# Patient Record
Sex: Female | Born: 1980 | Race: Black or African American | Hispanic: No | Marital: Single | Smoking: Never smoker
Health system: Southern US, Community
[De-identification: ages and names within clinical notes are randomized; demographics above are authoritative.]

## PROBLEM LIST (undated history)

## (undated) ENCOUNTER — Inpatient Hospital Stay (HOSPITAL_COMMUNITY): Payer: Self-pay

## (undated) DIAGNOSIS — D219 Benign neoplasm of connective and other soft tissue, unspecified: Secondary | ICD-10-CM

## (undated) DIAGNOSIS — R7303 Prediabetes: Secondary | ICD-10-CM

## (undated) DIAGNOSIS — O24419 Gestational diabetes mellitus in pregnancy, unspecified control: Secondary | ICD-10-CM

## (undated) DIAGNOSIS — E559 Vitamin D deficiency, unspecified: Secondary | ICD-10-CM

## (undated) HISTORY — DX: Vitamin D deficiency, unspecified: E55.9

## (undated) HISTORY — DX: Prediabetes: R73.03

---
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
## 2001-05-20 ENCOUNTER — Emergency Department (HOSPITAL_COMMUNITY): Admission: EM | Admit: 2001-05-20 | Discharge: 2001-05-20 | Payer: Self-pay | Admitting: Emergency Medicine

## 2001-07-19 ENCOUNTER — Inpatient Hospital Stay (HOSPITAL_COMMUNITY): Admission: AD | Admit: 2001-07-19 | Discharge: 2001-07-19 | Payer: Self-pay | Admitting: Obstetrics and Gynecology

## 2002-06-09 ENCOUNTER — Other Ambulatory Visit: Admission: RE | Admit: 2002-06-09 | Discharge: 2002-06-09 | Payer: Self-pay | Admitting: Gynecology

## 2002-12-01 ENCOUNTER — Emergency Department (HOSPITAL_COMMUNITY): Admission: AD | Admit: 2002-12-01 | Discharge: 2002-12-01 | Payer: Self-pay | Admitting: Family Medicine

## 2003-03-11 ENCOUNTER — Inpatient Hospital Stay (HOSPITAL_COMMUNITY): Admission: AD | Admit: 2003-03-11 | Discharge: 2003-03-12 | Payer: Self-pay | Admitting: Gynecology

## 2003-07-19 ENCOUNTER — Emergency Department (HOSPITAL_COMMUNITY): Admission: EM | Admit: 2003-07-19 | Discharge: 2003-07-19 | Payer: Self-pay | Admitting: Family Medicine

## 2003-08-10 ENCOUNTER — Inpatient Hospital Stay (HOSPITAL_COMMUNITY): Admission: AD | Admit: 2003-08-10 | Discharge: 2003-08-11 | Payer: Self-pay | Admitting: *Deleted

## 2003-08-30 ENCOUNTER — Emergency Department (HOSPITAL_COMMUNITY): Admission: EM | Admit: 2003-08-30 | Discharge: 2003-08-30 | Payer: Self-pay | Admitting: Family Medicine

## 2003-08-31 ENCOUNTER — Emergency Department (HOSPITAL_COMMUNITY): Admission: EM | Admit: 2003-08-31 | Discharge: 2003-08-31 | Payer: Self-pay | Admitting: Family Medicine

## 2003-09-03 ENCOUNTER — Emergency Department (HOSPITAL_COMMUNITY): Admission: EM | Admit: 2003-09-03 | Discharge: 2003-09-04 | Payer: Self-pay | Admitting: Emergency Medicine

## 2003-10-14 ENCOUNTER — Inpatient Hospital Stay (HOSPITAL_COMMUNITY): Admission: AD | Admit: 2003-10-14 | Discharge: 2003-10-15 | Payer: Self-pay | Admitting: Obstetrics & Gynecology

## 2003-10-17 ENCOUNTER — Inpatient Hospital Stay (HOSPITAL_COMMUNITY): Admission: AD | Admit: 2003-10-17 | Discharge: 2003-10-17 | Payer: Self-pay | Admitting: *Deleted

## 2003-11-23 ENCOUNTER — Other Ambulatory Visit: Admission: RE | Admit: 2003-11-23 | Discharge: 2003-11-23 | Payer: Self-pay | Admitting: Obstetrics and Gynecology

## 2004-01-17 ENCOUNTER — Inpatient Hospital Stay (HOSPITAL_COMMUNITY): Admission: AD | Admit: 2004-01-17 | Discharge: 2004-01-17 | Payer: Self-pay | Admitting: Obstetrics and Gynecology

## 2004-01-31 ENCOUNTER — Inpatient Hospital Stay (HOSPITAL_COMMUNITY): Admission: AD | Admit: 2004-01-31 | Discharge: 2004-01-31 | Payer: Self-pay | Admitting: Obstetrics and Gynecology

## 2004-02-01 ENCOUNTER — Inpatient Hospital Stay (HOSPITAL_COMMUNITY): Admission: AD | Admit: 2004-02-01 | Discharge: 2004-02-01 | Payer: Self-pay | Admitting: Obstetrics and Gynecology

## 2004-05-13 ENCOUNTER — Observation Stay (HOSPITAL_COMMUNITY): Admission: AD | Admit: 2004-05-13 | Discharge: 2004-05-14 | Payer: Self-pay | Admitting: Obstetrics and Gynecology

## 2004-06-01 ENCOUNTER — Inpatient Hospital Stay (HOSPITAL_COMMUNITY): Admission: AD | Admit: 2004-06-01 | Discharge: 2004-06-01 | Payer: Self-pay | Admitting: Obstetrics and Gynecology

## 2004-06-17 ENCOUNTER — Inpatient Hospital Stay (HOSPITAL_COMMUNITY): Admission: AD | Admit: 2004-06-17 | Discharge: 2004-06-20 | Payer: Self-pay | Admitting: Obstetrics and Gynecology

## 2004-06-17 ENCOUNTER — Inpatient Hospital Stay (HOSPITAL_COMMUNITY): Admission: AD | Admit: 2004-06-17 | Discharge: 2004-06-17 | Payer: Self-pay | Admitting: Obstetrics and Gynecology

## 2004-09-09 ENCOUNTER — Emergency Department (HOSPITAL_COMMUNITY): Admission: EM | Admit: 2004-09-09 | Discharge: 2004-09-09 | Payer: Self-pay | Admitting: Family Medicine

## 2005-05-11 ENCOUNTER — Emergency Department (HOSPITAL_COMMUNITY): Admission: EM | Admit: 2005-05-11 | Discharge: 2005-05-12 | Payer: Self-pay | Admitting: Emergency Medicine

## 2005-08-20 ENCOUNTER — Other Ambulatory Visit: Admission: RE | Admit: 2005-08-20 | Discharge: 2005-08-20 | Payer: Self-pay | Admitting: Obstetrics and Gynecology

## 2006-01-09 ENCOUNTER — Inpatient Hospital Stay (HOSPITAL_COMMUNITY): Admission: AD | Admit: 2006-01-09 | Discharge: 2006-01-09 | Payer: Self-pay | Admitting: Obstetrics and Gynecology

## 2006-11-12 ENCOUNTER — Emergency Department (HOSPITAL_COMMUNITY): Admission: EM | Admit: 2006-11-12 | Discharge: 2006-11-12 | Payer: Self-pay | Admitting: Emergency Medicine

## 2009-12-17 ENCOUNTER — Inpatient Hospital Stay (HOSPITAL_COMMUNITY)
Admission: AD | Admit: 2009-12-17 | Discharge: 2009-12-17 | Payer: Self-pay | Source: Home / Self Care | Admitting: Obstetrics & Gynecology

## 2010-03-27 LAB — GC/CHLAMYDIA PROBE AMP, GENITAL
Chlamydia, DNA Probe: NEGATIVE
GC Probe Amp, Genital: NEGATIVE

## 2010-03-27 LAB — WET PREP, GENITAL
Trich, Wet Prep: NONE SEEN
Yeast Wet Prep HPF POC: NONE SEEN

## 2010-03-27 LAB — POCT PREGNANCY, URINE: Preg Test, Ur: NEGATIVE

## 2010-06-02 NOTE — H&P (Signed)
[name], [name]             ACCOUNT NO.:  [id number]   MEDICAL RECORD NO.:  [id number]          PATIENT TYPE:  INP   LOCATION:  9168                          FACILITY:  [facility]   PHYSICIAN:  Jacqueline [name], M.D. DATE OF BIRTH:  16-Aug-1980   DATE OF ADMISSION:  06/17/2004  DATE OF DISCHARGE:                                HISTORY & PHYSICAL   This is a 30 year old, gravida 2, para 0-0-1-0, at 39-4/7th weeks who  presents unannounced with complaints of regular contractions increased in  frequency.  She was discharged home at 8:30 this morning with no cervical  change at 1-cm after a morphine sleeper.   This pregnancy has been followed by Dr. Stefano [name] and remarkable for:  1.  Anxiety.  2.  PENICILLIN allergy.  3.  History of gonorrhea.  4.  History of abnormal Pap.  5.  Group B strep negative.   ALLERGIES:  PENICILLIN causes a rash.   OBSTETRICAL HISTORY:  Elective abortion 2005 at 10 weeks' gestation.   MEDICAL HISTORY:  1.  Gonorrhea in 2005 which was treated.  2.  Childhood Varicella.  3.  History of abnormal Pap smear in 2003.  4.  Borderline anemia.  5.  History of anxiety for which she took Lexapro prior to pregnancy.   FAMILY HISTORY:  Remarkable for a strong family history of hypertension.  Brother with heart disease.  Brother with sickle cell trait.  Uncle with TB.  Grandmother and brother and uncle with diabetes.  Grandmother and brother  with stroke.   GENETIC HISTORY:  Remarkable for brother with sickle cell trait and  grandmother with twins.   SOCIAL HISTORY:  The patient is single.  Father of the baby is not involved.  Her room mate is with her as Psychologist, occupational.  She works as a Associate Professor.  She does  not report a religious affiliation.  She denies any alcohol, tobacco, or  drug use.   PRENATAL LABS:  Hemoglobin is 10.8, platelets 205.  Blood type A positive.  Antibody screen negative.  Sickle cell negative.  RPR nonreactive.  Hepatitis negative.  HIV  negative.  Pap test normal.  Gonorrhea negative.  Chlamydia negative.  Group B strep negative.   HISTORY OF CURRENT PREGNANCY:  The patient entered care at 10 weeks'  gestation.  Test to cure for gonorrhea treatment was done and was negative.  She had an ultrasound at 19 weeks which was normal.  She had an episode of  dizziness at 20 weeks.  She had another ultrasound at 22 weeks which was  normal.  Glucola at 30 weeks was normal.  Her Group B strep and cultures  were negative at term.   OBJECTIVE DATA:  VITAL SIGNS:  Stable, afebrile.  HEENT:  Within normal limits.  NECK:  Thyroid normal, not enlarged.  CHEST:  Clear to auscultation.  HEART:  Regular rate and rhythm.  ABDOMEN:  Gravid at 38-cm.  Vertex by Leopold's.  Fetal monitor shows a  reactive fetal heart rate tracing with uterine contractions every one and a  half to two minutes.  PELVIC:  Cervix  is 2- to -3-cm, 90% effaced, -1 station, with a vertex  presentation.  There is positive bloody show and intact membranes.  EXTREMITIES:  Within normal limits.   ASSESSMENT:  1.  Intrauterine pregnancy at 39-4/7th weeks.  2.  Latent phase labor.   PLAN:  1.  Per Dr. Estanislado Pandy, admit to birthing suite.  2.  Routine M.D. orders.  3.  Further to follow.      MLW/MEDQ  D:  06/17/2004  T:  06/17/2004  Job:  [id number]

## 2010-06-02 NOTE — Op Note (Signed)
[name], Jacqueline [name]             ACCOUNT NO.:  [id number]   MEDICAL RECORD NO.:  [id number]          PATIENT TYPE:  INP   LOCATION:  9120                          [facility]  [facility]   PHYSICIAN:  Crist Fat. Rivard, M.D. DATE OF BIRTH:  1980/06/05   DATE OF PROCEDURE:  06/17/2004  DATE OF DISCHARGE:                                 OPERATIVE REPORT   PREOPERATIVE DIAGNOSIS:  Intrauterine pregnancy at 39 weeks and 4 days with  failure to progress.   POSTOPERATIVE DIAGNOSIS:  Intrauterine pregnancy at 39 weeks and 4 days with  failure to progress.   PROCEDURE:  Primary low transverse cesarean section.   SURGEON:  Crist Fat. Rivard, M.D.   ASSISTANT:  Elby Showers. Williams, C.N.M.   ESTIMATED BLOOD LOSS:  800 mL.   PROCEDURE:  After being informed of the planned procedure with possible  complications including bleeding, infection, injury to bowel, bladder or  ureters, informed consent is obtained. The patient is taken to OR #4 and  preexisting epidural anesthesia was reinforced. The patient was placed in  the dorsal decubitus position, pelvis tilted to the left. She is prepped and  draped in a sterile fashion and a Foley catheter is already in her bladder.   After assessing adequate level of anesthesia, we infiltrate the suprapubic  area with 20 mL of Marcaine 0.25 and perform a Pfannenstiel incision which  was brought down to the fascia. Fascia is incised in a low transverse  fashion. Linea alba is dissected and peritoneum is entered in the midline  fashion. Visceral peritoneum was entered in a low transverse fashion  allowing [place] to safely retract bladder by developing a bladder flap.  Myometrium was then entered in a low transverse fashion first sharply and  extended bluntly. Amniotic fluid is clear. We assist the birth of a female  infant in LOA. Mouth and nose were suctioned with DeLee suction. Body was  delivered. Cord was clamped with two Kelly clamps and sectioned and the baby  was given to the pediatrician present in the room. The patient is given  clindamycin 900 milligrams IV and the placenta is allowed to deliver  spontaneously. It is complete, the cord has three vessels and uterine  revision is negative.   The uterus was then closed in two layers first with a running locked suture  of 0 Vicryl then with a Lembert suture of 0 Vicryl imbricating the first  layer. Hemostasis was completed midline with a figure-of-eight stitch of 0  Vicryl. Hemostasis was checked and adequate. Both paracolic gutters were  cleansed. Tubes and ovaries were assessed and normal. The pelvis was then  profusely irrigated with warm saline. Hemostasis was rechecked and adequate.   Under fascia hemostasis was completed with cautery and the fascia was closed  with two running suture of 0 Vicryl meeting midline. The wound was irrigated  with warm saline. Hemostasis was completed with cautery and the skin is  closed with subcuticular suture of 3-0 Monocryl and Steri-Strips.   Instrument and sponge count is complete x2. Estimated blood loss is 800 mL.  The procedure was well tolerated  by the patient who is taken to recovery  room in a well and stable condition. The little girl named [place] was born  at 11:15 p.m., received an Apgar of 9 at 1 minute, 9 at 5 minutes and weighs  7 pounds 6 ounces.       SAR/MEDQ  D:  06/17/2004  T:  06/18/2004  Job:  [id number]
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
## 2010-06-02 NOTE — Discharge Summary (Signed)
Jacqueline [name], Jacqueline [name]             ACCOUNT NO.:  [id number]   MEDICAL RECORD NO.:  [id number]          PATIENT TYPE:  INP   LOCATION:  9120                          [facility]   PHYSICIAN:  Crist Fat. Rivard, M.D. DATE OF BIRTH:  1980/09/25   DATE OF ADMISSION:  06/17/2004  DATE OF DISCHARGE:  06/20/2004                                 DISCHARGE SUMMARY   ADMISSION DIAGNOSES:  1.  Intrauterine pregnancy at 39-4/7 weeks.  2.  Latent phase labor.   DISCHARGE DIAGNOSES:  1.  Intrauterine pregnancy at 39-4/7 weeks.  2.  Failure to progress in labor.  3.  Status post primary low transverse Cesarean section of a female infant      named [place].  Apgar's 9 and 9, weighing 7 pounds 6 ounces.  4.  Postoperative anemia without hemodynamic compromise.   HOSPITAL PROCEDURES:  1.  Amniotomy.  2.  Electronic fetal monitoring.  3.  Internal fetal monitoring.  4.  Amnio-infusion.  5.  Primary low transverse Cesarean section.  6.  Epidural anesthesia.   HOSPITAL COURSE:  The patient was admitted in latent phase labor with cervix  dilated 2 to 3 cm and contractions every one-and-one-half to two minutes.  She received an epidural for pain and progressed to 4 cm later that  afternoon.  Amniotomy was performed to enhance labor and intrauterine  pressure catheter was inserted.  Amnio-infusion was started secondary to  repetitive variable decelerations and meconium-stained fluid.  Several hours  later cervical dilation was noted to have halted and decision was made at  that point to progress with primary low transverse Cesarean section for  failure to progress.  This was performed under epidural anesthesia by Dr.  Estanislado Pandy. EBL was 800 cc.  Female infant, [place], was born with Apgar's 9  and 9 and weight 7 pounds 6 ounces.  Patient and baby eventually were taken  to mother/baby unit where they did well.  Hemoglobin was 8.8 but patient  declined blood transfusion and was hemodynamically  stable.  Routine care was  given.  On postoperative day #2 she continued to improve, had some abdominal  distention secondary to gas.  On postoperative day #3 she was ready to go  home.  She received Dulcolax suppository for relief of gas.  She was  planning Micronor for contraception.  Vital signs were stable.  Heart  regular rate and rhythm.  Chest  was clear.  Abdomen was soft and  appropriately tender.  Incision was clean, dry and intact.  Lochia was  small.  Extremities within normal limits.  She is deemed to have received  the full benefit of her hospital stay and was discharged home.   DISCHARGE MEDICATIONS:  1.  Motrin 600 mg p.o. q.6h.  PRN.  2.  Tylox one to two q.4h. PRN.  3.  Micronor one p.o. daily.   DISCHARGE LABORATORY DATA:  White blood cell count 12.6, hemoglobin 8.8,  platelet count 128,000.  RPR nonreactive.   DISCHARGE INSTRUCTIONS:  Per [facility] OB handout.   FOLLOW UP:  Follow up in six weeks or PRN.  MLW/MEDQ  D:  06/20/2004  T:  06/20/2004  Job:  [id number]

## 2011-10-24 ENCOUNTER — Emergency Department (HOSPITAL_COMMUNITY)
Admission: EM | Admit: 2011-10-24 | Discharge: 2011-10-24 | Disposition: A | Discharge status: Home / Self Care | Payer: 59 | Source: Home / Self Care | Attending: Family Medicine | Admitting: Family Medicine

## 2011-10-24 ENCOUNTER — Encounter (HOSPITAL_COMMUNITY): Payer: Self-pay

## 2011-10-24 DIAGNOSIS — R197 Diarrhea, unspecified: Secondary | ICD-10-CM

## 2011-10-24 DIAGNOSIS — J069 Acute upper respiratory infection, unspecified: Secondary | ICD-10-CM

## 2011-10-24 MED ORDER — PHENYLEPH-PROMETHAZINE-COD 5-6.25-10 MG/5ML PO SYRP: ORAL_SOLUTION | ORAL | Status: DC

## 2011-10-24 MED ORDER — AZITHROMYCIN 250 MG PO TABS: 250.0000 mg | ORAL_TABLET | Freq: Every day | ORAL | Status: DC

## 2011-10-24 NOTE — ED Provider Notes (Signed)
History     CSN: [id number]  Arrival date & time 10/24/11  0901   First MD Initiated Contact with Patient 10/24/11 1024      Chief Complaint  Patient presents with  . Sore Throat    (Consider location/radiation/quality/duration/timing/severity/associated sxs/prior treatment) HPI Comments: The patient reports she started with diarrhea Sunday. Has continued until today. She has had a sore throat but has not measured a fever although she has had sweats and chills. She denies any vomiting. She admits to some cough. No runny nose or ear pain. Has taken Nyquil.   The history is provided by the patient.    History reviewed. No pertinent past medical history.  History reviewed. No pertinent past surgical history.  No family history on file.  History  Substance Use Topics  . Smoking status: Never Smoker   . Smokeless tobacco: Not on file  . Alcohol Use: No    OB History    Grav Para Term Preterm Abortions TAB SAB Ect Mult Living                  Review of Systems  Constitutional: Positive for fever, chills, appetite change and fatigue.  HENT: Positive for ear pain, congestion, sore throat, voice change and ear discharge. Negative for neck pain.   Respiratory: Positive for cough. Negative for chest tightness, shortness of breath and wheezing.   Cardiovascular: Negative.   Gastrointestinal: Positive for nausea and diarrhea. Negative for vomiting.  Genitourinary: Negative.   Musculoskeletal: Positive for arthralgias.  Skin: Negative.     Allergies  Penicillins  Home Medications   Current Outpatient Rx  Name Route Sig Dispense Refill  . AZITHROMYCIN 250 MG PO TABS Oral Take 1 tablet (250 mg total) by mouth daily. Take first 2 tablets together, then 1 every day until finished. 6 tablet 0  . PHENYLEPH-PROMETHAZINE-COD 5-6.25-10 MG/5ML PO SYRP  1-2 tsp po  q 6 hrs pr cough/nausea 120 mL 0    BP 150/93  Pulse 83  Temp 98.2 F (36.8 C) (Oral)  Resp 19  SpO2 99%  LMP  09/22/2011  Physical Exam  Nursing note and vitals reviewed. Constitutional: She appears well-developed and well-nourished. No distress.  HENT:  Head: Normocephalic and atraumatic.       Ears clear, nose clear, throat red. Mucus membranes moist and pink  Neck: Normal range of motion. Neck supple.  Cardiovascular: Normal rate, regular rhythm and normal heart sounds.   Pulmonary/Chest: Effort normal and breath sounds normal.  Abdominal: Soft. Bowel sounds are normal. She exhibits no mass. There is no tenderness. There is no rebound and no guarding.  Musculoskeletal: Normal range of motion.  Lymphadenopathy:    She has no cervical adenopathy.  Neurological: She is alert.    ED Course  Procedures (including critical care time)   Labs Reviewed  POCT RAPID STREP A ([facility]RG CARE ONLY)   No results found.   1. Diarrhea   2. URI (upper respiratory infection)       MDM          Randa Spike, MD 10/24/11 1042

## 2011-10-24 NOTE — ED Notes (Signed)
C/o sore throat and ear pain, body aches and some diarrhea sx started Sunday

## 2011-12-05 ENCOUNTER — Encounter (HOSPITAL_COMMUNITY): Payer: Self-pay | Admitting: *Deleted

## 2011-12-05 ENCOUNTER — Emergency Department (HOSPITAL_COMMUNITY)
Admission: EM | Admit: 2011-12-05 | Discharge: 2011-12-05 | Disposition: A | Discharge status: Home / Self Care | Payer: 59 | Source: Home / Self Care | Attending: Family Medicine | Admitting: Family Medicine

## 2011-12-05 DIAGNOSIS — J0300 Acute streptococcal tonsillitis, unspecified: Secondary | ICD-10-CM

## 2011-12-05 DIAGNOSIS — J020 Streptococcal pharyngitis: Secondary | ICD-10-CM

## 2011-12-05 LAB — POCT RAPID STREP A: Streptococcus, Group A Screen (Direct): POSITIVE — AB

## 2011-12-05 MED ORDER — HYDROCODONE-ACETAMINOPHEN 7.5-500 MG/15ML PO SOLN: 10.0000 mL | Freq: Three times a day (TID) | ORAL | Status: DC | PRN

## 2011-12-05 MED ORDER — IBUPROFEN 800 MG PO TABS
ORAL_TABLET | ORAL | Status: AC
  Filled 2011-12-05: qty 1

## 2011-12-05 MED ORDER — IBUPROFEN 800 MG PO TABS: 800.0000 mg | ORAL_TABLET | Freq: Three times a day (TID) | ORAL | Status: DC

## 2011-12-05 MED ORDER — CEPHALEXIN 500 MG PO CAPS: 500.0000 mg | ORAL_CAPSULE | Freq: Three times a day (TID) | ORAL | Status: DC

## 2011-12-05 MED ORDER — CEFTRIAXONE SODIUM 1 G IJ SOLR
INTRAMUSCULAR | Status: AC
  Filled 2011-12-05: qty 10

## 2011-12-05 MED ORDER — IBUPROFEN 800 MG PO TABS
800.0000 mg | ORAL_TABLET | Freq: Once | ORAL | Status: AC
  Administered 2011-12-05: 800 mg via ORAL

## 2011-12-05 MED ORDER — CEFTRIAXONE SODIUM 1 G IJ SOLR
1.0000 g | Freq: Once | INTRAMUSCULAR | Status: AC
  Administered 2011-12-05: 1 g via INTRAMUSCULAR

## 2011-12-05 NOTE — ED Notes (Signed)
Pt  Reports symptoms  Of  sorethroat            With  Pain  When   She  Swallows   Symptoms    X  2  Days         Pt  Also  Reports  Body  Aches  As  Well         Pt  Has  A  Hoarse  Voice    -  No  resp  Distress

## 2011-12-05 NOTE — ED Provider Notes (Signed)
History     CSN: [id number]  Arrival date & time 12/05/11  0802   First MD Initiated Contact with Patient 12/05/11 0805      Chief Complaint  Patient presents with  . Sore Throat    (Consider location/radiation/quality/duration/timing/severity/associated sxs/prior treatment) HPI Comments: 31 year old female with no significant past medical history. Nonsmoker. Here complaining of sore throat, headache and pain with swallowing since yesterday. Reports subjective fever last night. Taking over-the-counter Tylenol last time about 2 hours ago. Denies cough or nasal congestion. No chest pain or shortness of breath. Reports abdominal discomfort and nausea. No vomiting or diarrhea. No rash.   History reviewed. No pertinent past medical history.  History reviewed. No pertinent past surgical history.  No family history on file.  History  Substance Use Topics  . Smoking status: Never Smoker   . Smokeless tobacco: Not on file  . Alcohol Use: No    OB History    Grav Para Term Preterm Abortions TAB SAB Ect Mult Living                  Review of Systems  Constitutional: Positive for fever. Negative for diaphoresis, appetite change and fatigue.  HENT: Positive for sore throat and trouble swallowing. Negative for congestion, rhinorrhea and sinus pressure.   Respiratory: Negative for cough and shortness of breath.   Gastrointestinal: Negative for nausea, vomiting and abdominal pain.  Skin: Negative for rash.  Neurological: Positive for headaches.  All other systems reviewed and are negative.    Allergies  Penicillins  Home Medications   Current Outpatient Rx  Name  Route  Sig  Dispense  Refill  . AZITHROMYCIN 250 MG PO TABS   Oral   Take 1 tablet (250 mg total) by mouth daily. Take first 2 tablets together, then 1 every day until finished.   6 tablet   0   . CEPHALEXIN 500 MG PO CAPS   Oral   Take 1 capsule (500 mg total) by mouth 3 (three) times daily.   30 capsule  0   . HYDROCODONE-ACETAMINOPHEN 7.5-500 MG/15ML PO SOLN   Oral   Take 10 mLs by mouth every 8 (eight) hours as needed for pain.   120 mL   0   . IBUPROFEN 800 MG PO TABS   Oral   Take 1 tablet (800 mg total) by mouth 3 (three) times daily.   21 tablet   0   . PHENYLEPH-PROMETHAZINE-COD 5-6.25-10 MG/5ML PO SYRP      1-2 tsp po  q 6 hrs pr cough/nausea   120 mL   0     BP 121/81  Pulse 90  Temp 98.8 F (37.1 C) (Oral)  Resp 20  SpO2 100%  Physical Exam  Nursing note and vitals reviewed. Constitutional: She is oriented to person, place, and time. She appears well-developed and well-nourished. No distress.  HENT:  Head: Normocephalic and atraumatic.  Right Ear: External ear normal.  Left Ear: External ear normal.       Nose normal. Significant pharyngeal and tonsillar erythema with exudates. No uvula deviation. No trismus. TM's with increased vascular markings and some dullness bilaterally no swelling or bulging   Eyes: Conjunctivae normal are normal. No scleral icterus.  Neck: Neck supple. No thyromegaly present.  Cardiovascular: Normal heart sounds.   No murmur heard. Pulmonary/Chest: Breath sounds normal.  Abdominal: Soft. There is no tenderness.       No hepatosplenomegaly.  Lymphadenopathy:    She has cervical  adenopathy.  Neurological: She is alert and oriented to person, place, and time.  Skin: No rash noted. She is not diaphoretic.    ED Course  Procedures (including critical care time)  Labs Reviewed  POCT RAPID STREP A ([facility] ONLY) - Abnormal; Notable for the following:    Streptococcus, Group A Screen (Direct) POSITIVE (*)     All other components within normal limits   No results found.   1. Acute streptococcal tonsillitis       MDM  31 year old female here with bilateral exudative strep positive tonsillitis. History of penicillin allergies. Was challenged with a ceftriaxone IM dose here without side effects. Treated with Rocephin 1  g IM x1 ibuprofen 800 mg oral x1 here. Prescribed cephalexin 3 times a day for 10 days, hydrocodone/acetaminophen and ibuprofen prn. Supportive care and red flags that should prompt her return to medical attention discussed with patient and provided in writing.         Sharin Grave, MD 12/07/11 1443

## 2016-01-16 NOTE — L&D Delivery Note (Signed)
Delivery Note At  a viable female was delivered via  (Presentation vertex: ; OA ).  APGAR:7 ,9 ; weight  .   Placenta status:spont , shultz.  Cord:3vc  with the following complications: .  Cord pH: pending  Anesthesia:  epiduralEpisiotomy:   Lacerations:  1 degree vaginal Suture Repair: 2.0 vicryl Est. Blood Loss 100 (mL):    Mom to postpartum.  Baby to Couplet care / Skin to Skin.  Jacqueline [name] 11/18/2016, 11:48 AM

## 2016-02-12 ENCOUNTER — Encounter (HOSPITAL_COMMUNITY): Payer: Self-pay | Admitting: *Deleted

## 2016-02-12 ENCOUNTER — Inpatient Hospital Stay (HOSPITAL_COMMUNITY)
Admission: AD | Admit: 2016-02-12 | Discharge: 2016-02-12 | Disposition: A | Discharge status: Home / Self Care | Payer: Self-pay | Source: Ambulatory Visit | Attending: Obstetrics & Gynecology | Admitting: Obstetrics & Gynecology

## 2016-02-12 DIAGNOSIS — Z880 Allergy status to penicillin: Secondary | ICD-10-CM | POA: Insufficient documentation

## 2016-02-12 DIAGNOSIS — B9689 Other specified bacterial agents as the cause of diseases classified elsewhere: Secondary | ICD-10-CM

## 2016-02-12 DIAGNOSIS — N898 Other specified noninflammatory disorders of vagina: Secondary | ICD-10-CM | POA: Insufficient documentation

## 2016-02-12 DIAGNOSIS — Z79899 Other long term (current) drug therapy: Secondary | ICD-10-CM | POA: Insufficient documentation

## 2016-02-12 DIAGNOSIS — N760 Acute vaginitis: Secondary | ICD-10-CM

## 2016-02-12 DIAGNOSIS — Z9889 Other specified postprocedural states: Secondary | ICD-10-CM | POA: Insufficient documentation

## 2016-02-12 LAB — POCT PREGNANCY, URINE: Preg Test, Ur: NEGATIVE

## 2016-02-12 LAB — URINALYSIS, ROUTINE W REFLEX MICROSCOPIC
BILIRUBIN URINE: NEGATIVE
GLUCOSE, UA: NEGATIVE mg/dL
HGB URINE DIPSTICK: NEGATIVE
Ketones, ur: NEGATIVE mg/dL
Leukocytes, UA: NEGATIVE
Nitrite: NEGATIVE
PH: 5 (ref 5.0–8.0)
Protein, ur: NEGATIVE mg/dL
SPECIFIC GRAVITY, URINE: 1.021 (ref 1.005–1.030)

## 2016-02-12 LAB — WET PREP, GENITAL
SPERM: NONE SEEN
Trich, Wet Prep: NONE SEEN
Yeast Wet Prep HPF POC: NONE SEEN

## 2016-02-12 MED ORDER — METRONIDAZOLE 500 MG PO TABS: 500.0000 mg | ORAL_TABLET | Freq: Two times a day (BID) | ORAL | 0 refills | Status: DC

## 2016-02-12 NOTE — Discharge Instructions (Signed)
Get your medication and take all as directed. Establish with a doctor in [place] and follow up for continued care.

## 2016-02-12 NOTE — MAU Note (Signed)
Vaginal irritation, soreness and odor.  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

## 2016-02-12 NOTE — MAU Provider Note (Signed)
History     CSN: [id number]  Arrival date and time: 02/12/16 1013   First Provider Initiated Contact with Patient 02/12/16 1047      Chief Complaint  Patient presents with  . vaginal irritation   HPI Jacqueline [name] 36 y.o. comes in as she thinks she has BV again.  Was treated last in December by her doctor in [place], but is now living in [place].  Is transferring her insurance and has not established with an MD here yet.  Having vaginal discharge with odor.   OB History    Gravida Para Term Preterm AB Living   1 1 1     1    SAB TAB Ectopic Multiple Live Births           1      History reviewed. No pertinent past medical history.  Past Surgical History:  Procedure Laterality Date  . CESAREAN SECTION      History reviewed. No pertinent family history.  Social History  Substance Use Topics  . Smoking status: Never Smoker  . Smokeless tobacco: Never Used  . Alcohol use Yes     Comment: Occassional    Allergies:  Allergies  Allergen Reactions  . Penicillins Hives, Shortness Of Breath and Other (See Comments)    Has patient had a PCN reaction causing immediate rash, facial/tongue/throat swelling, SOB or lightheadedness with hypotension: Yes Has patient had a PCN reaction causing severe rash involving mucus membranes or skin necrosis: No Has patient had a PCN reaction that required hospitalization No Has patient had a PCN reaction occurring within the last 10 years: Yes If all of the above answers are "NO", then may proceed with Cephalosporin use.    Prescriptions Prior to Admission  Medication Sig Dispense Refill Last Dose  . mometasone (NASONEX) 50 MCG/ACT nasal spray Place 1 spray into the nose daily.   02/12/2016 at Unknown time    Review of Systems  Constitutional: Negative for fever.  Gastrointestinal: Negative for abdominal pain, nausea and vomiting.  Genitourinary: Positive for vaginal discharge. Negative for dysuria.       Odor with  vaginal discharge   Physical Exam   Blood pressure 129/77, pulse 75, temperature 98.3 F (36.8 C), temperature source Oral, resp. rate 16, last menstrual period 02/02/2016, SpO2 99 %.  Physical Exam  Nursing note and vitals reviewed. Constitutional: She is oriented to person, place, and time. She appears well-developed and well-nourished.  HENT:  Head: Normocephalic.  Eyes: EOM are normal.  Neck: Neck supple.  GI: Soft. There is no tenderness.  Genitourinary:  Genitourinary Comments: Speculum exam:vulva - vaginal discharge seen on labia minora prior to speculum being inserted Vagina - Small amount of liquid white discharge Cervix - No contact bleeding Bimanual exam: Cervix closed Uterus non tender, normal size Adnexa non tender, no masses bilaterally GC/Chlam, wet prep done Chaperone present for exam.   Musculoskeletal: Normal range of motion.  Neurological: She is alert and oriented to person, place, and time.  Skin: Skin is warm and dry.  Psychiatric: She has a normal mood and affect.    MAU Course  Procedures Results for orders placed or performed during the hospital encounter of 02/12/16 (from the past 24 hour(s))  Urinalysis, Routine w reflex microscopic     Status: Abnormal   Collection Time: 02/12/16 10:20 AM  Result Value Ref Range   Color, Urine YELLOW YELLOW   APPearance HAZY (A) CLEAR   Specific Gravity, Urine 1.021 1.005 - 1.030  pH 5.0 5.0 - 8.0   Glucose, UA NEGATIVE NEGATIVE mg/dL   Hgb urine dipstick NEGATIVE NEGATIVE   Bilirubin Urine NEGATIVE NEGATIVE   Ketones, ur NEGATIVE NEGATIVE mg/dL   Protein, ur NEGATIVE NEGATIVE mg/dL   Nitrite NEGATIVE NEGATIVE   Leukocytes, UA NEGATIVE NEGATIVE  Pregnancy, urine POC     Status: None   Collection Time: 02/12/16 10:36 AM  Result Value Ref Range   Preg Test, Ur NEGATIVE NEGATIVE  Wet prep, genital     Status: Abnormal   Collection Time: 02/12/16 10:48 AM  Result Value Ref Range   Yeast Wet Prep HPF POC  NONE SEEN NONE SEEN   Trich, Wet Prep NONE SEEN NONE SEEN   Clue Cells Wet Prep HPF POC PRESENT (A) NONE SEEN   WBC, Wet Prep HPF POC FEW (A) NONE SEEN   Sperm NONE SEEN     MDM Discussed current episode of BV and treatment  - will eprescribe metronidazole tablets to her pharmacy  Assessment and Plan  BV  Plan Get your medication and take all as directed. Establish with a doctor in [place] and follow up for continued care. Given info from [facility] on BV and alternate treatments when she has insurance.  Terri L Burleson 02/12/2016, 10:56 AM

## 2016-02-13 LAB — HIV ANTIBODY (ROUTINE TESTING W REFLEX): HIV Screen 4th Generation wRfx: NONREACTIVE

## 2016-02-13 LAB — RPR: RPR: NONREACTIVE

## 2016-02-14 LAB — GC/CHLAMYDIA PROBE AMP (~~LOC~~) NOT AT ARMC
Chlamydia: NEGATIVE
Neisseria Gonorrhea: NEGATIVE

## 2016-04-23 ENCOUNTER — Inpatient Hospital Stay (HOSPITAL_COMMUNITY)
Admission: AD | Admit: 2016-04-23 | Discharge: 2016-04-23 | Disposition: A | Discharge status: Home / Self Care | Payer: Medicaid Other | Source: Ambulatory Visit | Attending: Obstetrics & Gynecology | Admitting: Obstetrics & Gynecology

## 2016-04-23 ENCOUNTER — Encounter (HOSPITAL_COMMUNITY): Payer: Self-pay

## 2016-04-23 DIAGNOSIS — Z3689 Encounter for other specified antenatal screening: Secondary | ICD-10-CM

## 2016-04-23 DIAGNOSIS — Z3A01 Less than 8 weeks gestation of pregnancy: Secondary | ICD-10-CM | POA: Insufficient documentation

## 2016-04-23 DIAGNOSIS — B9689 Other specified bacterial agents as the cause of diseases classified elsewhere: Secondary | ICD-10-CM | POA: Diagnosis present

## 2016-04-23 DIAGNOSIS — N760 Acute vaginitis: Secondary | ICD-10-CM

## 2016-04-23 DIAGNOSIS — Z3490 Encounter for supervision of normal pregnancy, unspecified, unspecified trimester: Secondary | ICD-10-CM

## 2016-04-23 DIAGNOSIS — O23591 Infection of other part of genital tract in pregnancy, first trimester: Secondary | ICD-10-CM | POA: Insufficient documentation

## 2016-04-23 HISTORY — DX: Benign neoplasm of connective and other soft tissue, unspecified: D21.9

## 2016-04-23 LAB — URINALYSIS, ROUTINE W REFLEX MICROSCOPIC
Bilirubin Urine: NEGATIVE
Glucose, UA: NEGATIVE mg/dL
Hgb urine dipstick: NEGATIVE
Ketones, ur: NEGATIVE mg/dL
LEUKOCYTES UA: NEGATIVE
Nitrite: NEGATIVE
PH: 6 (ref 5.0–8.0)
Protein, ur: NEGATIVE mg/dL
SPECIFIC GRAVITY, URINE: 1.006 (ref 1.005–1.030)

## 2016-04-23 LAB — WET PREP, GENITAL
Sperm: NONE SEEN
TRICH WET PREP: NONE SEEN
Yeast Wet Prep HPF POC: NONE SEEN

## 2016-04-23 LAB — POCT PREGNANCY, URINE: Preg Test, Ur: POSITIVE — AB

## 2016-04-23 MED ORDER — METRONIDAZOLE 500 MG PO TABS: 500.0000 mg | ORAL_TABLET | Freq: Two times a day (BID) | ORAL | 0 refills | Status: AC

## 2016-04-23 NOTE — Discharge Instructions (Signed)
Bacterial Vaginosis °Bacterial vaginosis is a vaginal infection that occurs when the normal balance of bacteria in the vagina is disrupted. It results from an overgrowth of certain bacteria. This is the most common vaginal infection among women ages 15-44. °Because bacterial vaginosis increases your risk for STIs (sexually transmitted infections), getting treated can help reduce your risk for chlamydia, gonorrhea, herpes, and HIV (human immunodeficiency virus). Treatment is also important for preventing complications in pregnant women, because this condition can cause an early (premature) delivery. °What are the causes? °This condition is caused by an increase in harmful bacteria that are normally present in small amounts in the vagina. However, the reason that the condition develops is not fully understood. °What increases the risk? °The following factors may make you more likely to develop this condition: °· Having a new sexual partner or multiple sexual partners. °· Having unprotected sex. °· Douching. °· Having an intrauterine device (IUD). °· Smoking. °· Drug and alcohol abuse. °· Taking certain antibiotic medicines. °· Being pregnant. °You cannot get bacterial vaginosis from toilet seats, bedding, swimming pools, or contact with objects around you. °What are the signs or symptoms? °Symptoms of this condition include: °· Grey or white vaginal discharge. The discharge can also be watery or foamy. °· A fish-like odor with discharge, especially after sexual intercourse or during menstruation. °· Itching in and around the vagina. °· Burning or pain with urination. °Some women with bacterial vaginosis have no signs or symptoms. °How is this diagnosed? °This condition is diagnosed based on: °· Your medical history. °· A physical exam of the vagina. °· Testing a sample of vaginal fluid under a microscope to look for a large amount of bad bacteria or abnormal cells. Your health care provider may use a cotton swab or a  small wooden spatula to collect the sample. °How is this treated? °This condition is treated with antibiotics. These may be given as a pill, a vaginal cream, or a medicine that is put into the vagina (suppository). If the condition comes back after treatment, a second round of antibiotics may be needed. °Follow these instructions at home: °Medicines  °· Take over-the-counter and prescription medicines only as told by your health care provider. °· Take or use your antibiotic as told by your health care provider. Do not stop taking or using the antibiotic even if you start to feel better. °General instructions  °· If you have a female sexual partner, tell her that you have a vaginal infection. She should see her health care provider and be treated if she has symptoms. If you have a female sexual partner, he does not need treatment. °· During treatment: °¨ Avoid sexual activity until you finish treatment. °¨ Do not douche. °¨ Avoid alcohol as directed by your health care provider. °¨ Avoid breastfeeding as directed by your health care provider. °· Drink enough water and fluids to keep your urine clear or pale yellow. °· Keep the area around your vagina and rectum clean. °¨ Wash the area daily with warm water. °¨ Wipe yourself from front to back after using the toilet. °· Keep all follow-up visits as told by your health care provider. This is important. °How is this prevented? °· Do not douche. °· Wash the outside of your vagina with warm water only. °· Use protection when having sex. This includes latex condoms and dental dams. °· Limit how many sexual partners you have. To help prevent bacterial vaginosis, it is best to have sex with just one   partner (monogamous).  Make sure you and your sexual partner are tested for STIs.  Wear cotton or cotton-lined underwear.  Avoid wearing tight pants and pantyhose, especially during summer.  Limit the amount of alcohol that you drink.  Do not use any products that contain  nicotine or tobacco, such as cigarettes and e-cigarettes. If you need help quitting, ask your health care provider.  Do not use illegal drugs. Where to find more information:  Centers for Disease Control and Prevention: AppraiserFraud.fi  American Sexual Health Association (ASHA): www.ashastd.org  [place].S. Department of Health and Financial controller, Office on Women's Health: DustingSprays.pl or SecuritiesCard.it Contact a health care provider if:  Your symptoms do not improve, even after treatment.  You have more discharge or pain when urinating.  You have a fever.  You have pain in your abdomen.  You have pain during sex.  You have vaginal bleeding between periods. Summary  Bacterial vaginosis is a vaginal infection that occurs when the normal balance of bacteria in the vagina is disrupted.  Because bacterial vaginosis increases your risk for STIs (sexually transmitted infections), getting treated can help reduce your risk for chlamydia, gonorrhea, herpes, and HIV (human immunodeficiency virus). Treatment is also important for preventing complications in pregnant women, because the condition can cause an early (premature) delivery.  This condition is treated with antibiotic medicines. These may be given as a pill, a vaginal cream, or a medicine that is put into the vagina (suppository). This information is not intended to replace advice given to you by your health care provider. Make sure you discuss any questions you have with your health care provider. Document Released: 01/01/2005 Document Revised: 09/17/2015 Document Reviewed: 09/17/2015 Elsevier Interactive Patient Education  2017 [place] of Pregnancy The first trimester of pregnancy is from week 1 until the end of week 13 (months 1 through 3). A week after a sperm fertilizes an egg, the egg will implant on the wall of the uterus. This embryo will begin to develop  into a baby. Genes from you and your partner will form the baby. The female genes will determine whether the baby will be a boy or a girl. At 6-8 weeks, the eyes and face will be formed, and the heartbeat can be seen on ultrasound. At the end of 12 weeks, all the baby's organs will be formed. Now that you are pregnant, you will want to do everything you can to have a healthy baby. Two of the most important things are to get good prenatal care and to follow your health care provider's instructions. Prenatal care is all the medical care you receive before the baby's birth. This care will help prevent, find, and treat any problems during the pregnancy and childbirth. Body changes during your first trimester Your body goes through many changes during pregnancy. The changes vary from woman to woman.  You may gain or lose a couple of pounds at first.  You may feel sick to your stomach (nauseous) and you may throw up (vomit). If the vomiting is uncontrollable, call your health care provider.  You may tire easily.  You may develop headaches that can be relieved by medicines. All medicines should be approved by your health care provider.  You may urinate more often. Painful urination may mean you have a bladder infection.  You may develop heartburn as a result of your pregnancy.  You may develop constipation because certain hormones are causing the muscles that push stool through your intestines  to slow down.  You may develop hemorrhoids or swollen veins (varicose veins).  Your breasts may begin to grow larger and become tender. Your nipples may stick out more, and the tissue that surrounds them (areola) may become darker.  Your gums may bleed and may be sensitive to brushing and flossing.  Dark spots or blotches (chloasma, mask of pregnancy) may develop on your face. This will likely fade after the baby is born.  Your menstrual periods will stop.  You may have a loss of appetite.  You may  develop cravings for certain kinds of food.  You may have changes in your emotions from day to day, such as being excited to be pregnant or being concerned that something may go wrong with the pregnancy and baby.  You may have more vivid and strange dreams.  You may have changes in your hair. These can include thickening of your hair, rapid growth, and changes in texture. Some women also have hair loss during or after pregnancy, or hair that feels dry or thin. Your hair will most likely return to normal after your baby is born. What to expect at prenatal visits During a routine prenatal visit:  You will be weighed to make sure you and the baby are growing normally.  Your blood pressure will be taken.  Your abdomen will be measured to track your baby's growth.  The fetal heartbeat will be listened to between weeks 10 and 14 of your pregnancy.  Test results from any previous visits will be discussed. Your health care provider may ask you:  How you are feeling.  If you are feeling the baby move.  If you have had any abnormal symptoms, such as leaking fluid, bleeding, severe headaches, or abdominal cramping.  If you are using any tobacco products, including cigarettes, chewing tobacco, and electronic cigarettes.  If you have any questions. Other tests that may be performed during your first trimester include:  Blood tests to find your blood type and to check for the presence of any previous infections. The tests will also be used to check for low iron levels (anemia) and protein on red blood cells (Rh antibodies). Depending on your risk factors, or if you previously had diabetes during pregnancy, you may have tests to check for high blood sugar that affects pregnant women (gestational diabetes).  Urine tests to check for infections, diabetes, or protein in the urine.  An ultrasound to confirm the proper growth and development of the baby.  Fetal screens for spinal cord problems  (spina bifida) and Down syndrome.  HIV (human immunodeficiency virus) testing. Routine prenatal testing includes screening for HIV, unless you choose not to have this test.  You may need other tests to make sure you and the baby are doing well. Follow these instructions at home: Medicines   Follow your health care provider's instructions regarding medicine use. Specific medicines may be either safe or unsafe to take during pregnancy.  Take a prenatal vitamin that contains at least 600 micrograms (mcg) of folic acid.  If you develop constipation, try taking a stool softener if your health care provider approves. Eating and drinking   Eat a balanced diet that includes fresh fruits and vegetables, whole grains, good sources of protein such as meat, eggs, or tofu, and low-fat dairy. Your health care provider will help you determine the amount of weight gain that is right for you.  Avoid raw meat and uncooked cheese. These carry germs that can cause birth defects  in the baby.  Eating four or five small meals rather than three large meals a day may help relieve nausea and vomiting. If you start to feel nauseous, eating a few soda crackers can be helpful. Drinking liquids between meals, instead of during meals, also seems to help ease nausea and vomiting.  Limit foods that are high in fat and processed sugars, such as fried and sweet foods.  To prevent constipation:  Eat foods that are high in fiber, such as fresh fruits and vegetables, whole grains, and beans.  Drink enough fluid to keep your urine clear or pale yellow. Activity   Exercise only as directed by your health care provider. Most women can continue their usual exercise routine during pregnancy. Try to exercise for 30 minutes at least 5 days a week. Exercising will help you:  Control your weight.  Stay in shape.  Be prepared for labor and delivery.  Experiencing pain or cramping in the lower abdomen or lower back is a good  sign that you should stop exercising. Check with your health care provider before continuing with normal exercises.  Try to avoid standing for long periods of time. Move your legs often if you must stand in one place for a long time.  Avoid heavy lifting.  Wear low-heeled shoes and practice good posture.  You may continue to have sex unless your health care provider tells you not to. Relieving pain and discomfort   Wear a good support bra to relieve breast tenderness.  Take warm sitz baths to soothe any pain or discomfort caused by hemorrhoids. Use hemorrhoid cream if your health care provider approves.  Rest with your legs elevated if you have leg cramps or low back pain.  If you develop varicose veins in your legs, wear support hose. Elevate your feet for 15 minutes, 3-4 times a day. Limit salt in your diet. Prenatal care   Schedule your prenatal visits by the twelfth week of pregnancy. They are usually scheduled monthly at first, then more often in the last 2 months before delivery.  Write down your questions. Take them to your prenatal visits.  Keep all your prenatal visits as told by your health care provider. This is important. Safety   Wear your seat belt at all times when driving.  Make a list of emergency phone numbers, including numbers for family, friends, the hospital, and police and fire departments. General instructions   Ask your health care provider for a referral to a local prenatal education class. Begin classes no later than the beginning of month 6 of your pregnancy.  Ask for help if you have counseling or nutritional needs during pregnancy. Your health care provider can offer advice or refer you to specialists for help with various needs.  Do not use hot tubs, steam rooms, or saunas.  Do not douche or use tampons or scented sanitary pads.  Do not cross your legs for long periods of time.  Avoid cat litter boxes and soil used by cats. These carry germs  that can cause birth defects in the baby and possibly loss of the fetus by miscarriage or stillbirth.  Avoid all smoking, herbs, alcohol, and medicines not prescribed by your health care provider. Chemicals in these products affect the formation and growth of the baby.  Do not use any products that contain nicotine or tobacco, such as cigarettes and e-cigarettes. If you need help quitting, ask your health care provider. You may receive counseling support and other resources to  help you quit.  Schedule a dentist appointment. At home, brush your teeth with a soft toothbrush and be gentle when you floss. Contact a health care provider if:  You have dizziness.  You have mild pelvic cramps, pelvic pressure, or nagging pain in the abdominal area.  You have persistent nausea, vomiting, or diarrhea.  You have a bad smelling vaginal discharge.  You have pain when you urinate.  You notice increased swelling in your face, hands, legs, or ankles.  You are exposed to fifth disease or chickenpox.  You are exposed to [place] measles (rubella) and have never had it. Get help right away if:  You have a fever.  You are leaking fluid from your vagina.  You have spotting or bleeding from your vagina.  You have severe abdominal cramping or pain.  You have rapid weight gain or loss.  You vomit blood or material that looks like coffee grounds.  You develop a severe headache.  You have shortness of breath.  You have any kind of trauma, such as from a fall or a car accident. Summary  The first trimester of pregnancy is from week 1 until the end of week 13 (months 1 through 3).  Your body goes through many changes during pregnancy. The changes vary from woman to woman.  You will have routine prenatal visits. During those visits, your health care provider will examine you, discuss any test results you may have, and talk with you about how you are feeling. This information is not intended to  replace advice given to you by your health care provider. Make sure you discuss any questions you have with your health care provider. Document Released: 12/26/2000 Document Revised: 12/14/2015 Document Reviewed: 12/14/2015 Elsevier Interactive Patient Education  2017 [employer].

## 2016-04-23 NOTE — MAU Note (Signed)
Pt states last week she went to another hospital with vaginal bleeding and had a positive pregnancy test. Pt states the bleeding has gone away. Pt states she started noticing about a week ago some vaginal discharge that smells bad.

## 2016-04-23 NOTE — MAU Provider Note (Signed)
Chief Complaint: Vaginal Discharge   First Provider Initiated Contact with Patient 04/23/16 2135        SUBJECTIVE HPI: [name] is a 36 y.o. G2P1001 at 103w1d by LMP who presents to maternity admissions reporting vaginal discharge with odor, similar to [place] she has had before.. She denies vaginal bleeding, vaginal itching/burning, urinary symptoms, h/a, dizziness, n/v, or fever/chills.   Went to [name] last week and had an [place] to rule out ectopic pregnancy. They saw a single viable fetus. Did not do swabs.  Lives in [name] and works in [name].  Started [employer] and wants to come here for [facility]  Vaginal Discharge  The patient's primary symptoms include a genital odor and vaginal discharge. The patient's pertinent negatives include no genital itching, genital lesions, genital rash, pelvic pain or vaginal bleeding. This is a new problem. The current episode started in the past 7 days. The problem occurs constantly. The problem has been unchanged. The patient is experiencing no pain. The problem affects both sides. She is pregnant. Pertinent negatives include no abdominal pain, back pain, chills, constipation, diarrhea, dysuria, fever, nausea or vomiting. The vaginal discharge was milky and malodorous. There has been no bleeding. She has not been passing clots. She has not been passing tissue. Nothing aggravates the symptoms. She has tried nothing for the symptoms. It is unknown whether or not her partner has an STD.   RN Note: Pt states last week she went to another hospital with vaginal bleeding and had a positive pregnancy test. Pt states the bleeding has gone away. Pt states she started noticing about a week ago some vaginal discharge that smells bad.   No past medical history on file. Past Surgical History:  Procedure Laterality Date  . CESAREAN SECTION     Social History   Social History  . Marital status: Single    Spouse name: N/A  . Number of children: N/A  . Years of  education: N/A   Occupational History  . Not on file.   Social History Main Topics  . Smoking status: Never Smoker  . Smokeless tobacco: Never Used  . Alcohol use Yes     Comment: Occassional  . Drug use: No  . Sexual activity: Not on file   Other Topics Concern  . Not on file   Social History Narrative  . No narrative on file   No current facility-administered medications on file prior to encounter.    Current Outpatient Prescriptions on File Prior to Encounter  Medication Sig Dispense Refill  . metroNIDAZOLE (FLAGYL) 500 MG tablet Take 1 tablet (500 mg total) by mouth 2 (two) times daily. No alcohol while taking this medication 14 tablet 0  . mometasone (NASONEX) 50 MCG/ACT nasal spray Place 1 spray into the nose daily.     Allergies  Allergen Reactions  . Penicillins Hives, Shortness Of Breath and Other (See Comments)    Has patient had a PCN reaction causing immediate rash, facial/tongue/throat swelling, SOB or lightheadedness with hypotension: Yes Has patient had a PCN reaction causing severe rash involving mucus membranes or skin necrosis: No Has patient had a PCN reaction that required hospitalization No Has patient had a PCN reaction occurring within the last 10 years: Yes If all of the above answers are "NO", then may proceed with Cephalosporin use.    I have reviewed patient's Past Medical Hx, Surgical Hx, Family Hx, Social Hx, medications and allergies.   ROS:  Review of Systems  Constitutional: Negative  for chills and fever.  Gastrointestinal: Negative for abdominal pain, constipation, diarrhea, nausea and vomiting.  Genitourinary: Positive for vaginal discharge. Negative for dysuria and pelvic pain.  Musculoskeletal: Negative for back pain.   Review of Systems  Other systems negative   Physical Exam  Physical Exam Patient Vitals for the past 24 hrs:  BP Temp Temp src Pulse Resp SpO2 Height Weight  04/23/16 1849 124/84 98 F (36.7 C) Oral 88 18 100 %  5\' 4"  (1.626 m) 171 lb (77.6 kg)   Constitutional: Well-developed, well-nourished female in no acute distress.  Cardiovascular: normal rate Respiratory: normal effort GI: Abd soft, non-tender. Pos BS x 4 MS: Extremities nontender, no edema, normal ROM Neurologic: Alert and oriented x 4.  GU: Neg CVAT.  Bedside US showed an active 7 wk fetal pole with FHR 160s.  PELVIC EXAM: Cervix pink, visually closed, without lesion, scant white creamy discharge, vaginal walls and external genitalia normal  LAB RESULTS Results for orders placed or performed during the hospital encounter of 04/23/16 (from the past 24 hour(s))  Urinalysis, Routine w reflex microscopic     Status: Abnormal   Collection Time: 04/23/16  7:24 PM  Result Value Ref Range   Color, Urine STRAW (A) YELLOW   APPearance CLEAR CLEAR   Specific Gravity, Urine 1.006 1.005 - 1.030   pH 6.0 5.0 - 8.0   Glucose, UA NEGATIVE NEGATIVE mg/dL   Hgb urine dipstick NEGATIVE NEGATIVE   Bilirubin Urine NEGATIVE NEGATIVE   Ketones, ur NEGATIVE NEGATIVE mg/dL   Protein, ur NEGATIVE NEGATIVE mg/dL   Nitrite NEGATIVE NEGATIVE   Leukocytes, UA NEGATIVE NEGATIVE  Pregnancy, urine POC     Status: Abnormal   Collection Time: 04/23/16  7:41 PM  Result Value Ref Range   Preg Test, Ur POSITIVE (A) NEGATIVE       IMAGING No results found.  MAU Management/MDM: Discussed bacterial vaginosis Will Rx Flagyl Message sent to [facility] for prenatal appt   ASSESSMENT Single IUP at [redacted]w[redacted]d Bacterial vaginosis  PLAN Discharge home Rx Flagyl x 7 days for BV Encouraged to seek prenatal care  Pt stable at time of discharge. Encouraged to return here or to other Urgent Care/ED if she develops worsening of symptoms, increase in pain, fever, or other concerning symptoms.    Hansel Feinstein CNM, MSN Certified Nurse-Midwife 04/23/2016  9:36 PM

## 2016-04-24 LAB — GC/CHLAMYDIA PROBE AMP (~~LOC~~) NOT AT ARMC
CHLAMYDIA, DNA PROBE: NEGATIVE
Neisseria Gonorrhea: NEGATIVE

## 2016-05-30 ENCOUNTER — Encounter: Payer: Self-pay | Admitting: Obstetrics & Gynecology

## 2016-05-30 ENCOUNTER — Ambulatory Visit (INDEPENDENT_AMBULATORY_CARE_PROVIDER_SITE_OTHER): Payer: Medicaid Other | Admitting: Obstetrics & Gynecology

## 2016-05-30 ENCOUNTER — Other Ambulatory Visit (HOSPITAL_COMMUNITY)
Admission: RE | Admit: 2016-05-30 | Discharge: 2016-05-30 | Disposition: A | Discharge status: Home / Self Care | Payer: Medicaid Other | Source: Ambulatory Visit | Attending: Obstetrics & Gynecology | Admitting: Obstetrics & Gynecology

## 2016-05-30 VITALS — BP 129/79 | HR 95 | Wt 174.0 lb

## 2016-05-30 DIAGNOSIS — O209 Hemorrhage in early pregnancy, unspecified: Secondary | ICD-10-CM | POA: Diagnosis not present

## 2016-05-30 DIAGNOSIS — O09529 Supervision of elderly multigravida, unspecified trimester: Secondary | ICD-10-CM

## 2016-05-30 DIAGNOSIS — O09521 Supervision of elderly multigravida, first trimester: Secondary | ICD-10-CM

## 2016-05-30 DIAGNOSIS — J302 Other seasonal allergic rhinitis: Secondary | ICD-10-CM | POA: Insufficient documentation

## 2016-05-30 DIAGNOSIS — R87810 Cervical high risk human papillomavirus (HPV) DNA test positive: Secondary | ICD-10-CM | POA: Insufficient documentation

## 2016-05-30 DIAGNOSIS — O34219 Maternal care for unspecified type scar from previous cesarean delivery: Secondary | ICD-10-CM

## 2016-05-30 DIAGNOSIS — Z3491 Encounter for supervision of normal pregnancy, unspecified, first trimester: Secondary | ICD-10-CM | POA: Insufficient documentation

## 2016-05-30 DIAGNOSIS — J3089 Other allergic rhinitis: Secondary | ICD-10-CM | POA: Diagnosis not present

## 2016-05-30 DIAGNOSIS — Z3480 Encounter for supervision of other normal pregnancy, unspecified trimester: Secondary | ICD-10-CM | POA: Insufficient documentation

## 2016-05-30 NOTE — Progress Notes (Signed)
  Subjective:    Jacqueline [name] is being seen today for her first obstetrical visit. [id number] s/p EAB x2. This is not a planned pregnancy. She is at [redacted]w[redacted]d gestation. Her obstetrical history is significant for advanced maternal age. Relationship with FOB: significant other, not living together. Patient does intend to breast feed. Pregnancy history fully reviewed.  Patient reports no complaints.  Review of Systems:   Review of Systems [place]  Objective:     BP 129/79   Pulse 95   Wt 174 lb (78.9 kg)   LMP 03/04/2016 (Exact Date)   BMI 29.87 kg/m  Physical Exam  Exam  General Appearance:    Alert, cooperative, no distress, appears stated age  Head:    Normocephalic, without obvious abnormality, atraumatic  Eyes:    conjunctiva/corneas clear, EOM's intact, both eyes  Ears:    Normal external ear canals, both ears  Nose:   Nares normal, septum midline, mucosa normal, no drainage    or sinus tenderness  Throat:   Lips, mucosa, and tongue normal; teeth and gums normal  Neck:   Supple, symmetrical, trachea midline, no adenopathy;    thyroid:  no enlargement/tenderness/nodules  Back:     Symmetric, no curvature, ROM normal, no CVA tenderness  Lungs:     Clear to auscultation bilaterally, respirations unlabored  Chest Wall:    No tenderness or deformity   Heart:    Regular rate and rhythm, S1 and S2 normal, no murmur, rub   or gallop  Breast Exam:    No tenderness, masses, or nipple abnormality  Abdomen:     Soft, non-tender, bowel sounds active all four quadrants,    no masses, no organomegaly  Genitalia:    Normal female without lesion, discharge or tenderness     Extremities:   Extremities normal, atraumatic, no cyanosis or edema  Pulses:   2+ and symmetric all extremities  Skin:   Skin color, texture, turgor normal, no rashes or lesions       Assessment:    Pregnancy: [id number] Patient Active Problem List   Diagnosis Date Noted  . Supervision of other normal pregnancy,  antepartum 05/30/2016  . Advanced maternal age in multigravida 05/30/2016  . Previous cesarean delivery, antepartum 05/30/2016        Plan:     Initial labs drawn. Prenatal vitamins. Problem list reviewed and updated. AFP3 discussed: requested. Role of ultrasound in pregnancy discussed; fetal survey: requested. Amniocentesis discussed: not indicated. Follow up in 4 weeks. 60% of 40 min visit spent on counseling and coordination of care.     Lavonia Drafts 05/30/2016

## 2016-05-30 NOTE — Progress Notes (Signed)
CLINICAL DATA:  Pregnant patient in 1st trimester pregnancy with some bleeding early in pregnancy  EXAM: AB OB ULTRASOUND   TECHNIQUE:  AB ultrasound was performed for complete evaluation of the gestation as well as the maternal uterus, adnexal regions, and pelvic cul-de-sac.   FINDINGS: Single intrauterine pregnancy Embryo:  present Cardiac Activity: present Heart Rate: 158 bpm.  CRL:5.71 cm [place] EDC: 12-09-16 Subchorionic hemorrhage:  none noted Kathrene Alu, RN

## 2016-05-30 NOTE — Patient Instructions (Signed)
First Trimester of Pregnancy The first trimester of pregnancy is from week 1 until the end of week 13 (months 1 through 3). A week after a sperm fertilizes an egg, the egg will implant on the wall of the uterus. This embryo will begin to develop into a baby. Genes from you and your partner will form the baby. The female genes will determine whether the baby will be a boy or a girl. At 6-8 weeks, the eyes and face will be formed, and the heartbeat can be seen on ultrasound. At the end of 12 weeks, all the baby's organs will be formed. Now that you are pregnant, you will want to do everything you can to have a healthy baby. Two of the most important things are to get good prenatal care and to follow your health care provider's instructions. Prenatal care is all the medical care you receive before the baby's birth. This care will help prevent, find, and treat any problems during the pregnancy and childbirth. Body changes during your first trimester Your body goes through many changes during pregnancy. The changes vary from woman to woman.  You may gain or lose a couple of pounds at first.  You may feel sick to your stomach (nauseous) and you may throw up (vomit). If the vomiting is uncontrollable, call your health care provider.  You may tire easily.  You may develop headaches that can be relieved by medicines. All medicines should be approved by your health care provider.  You may urinate more often. Painful urination may mean you have a bladder infection.  You may develop heartburn as a result of your pregnancy.  You may develop constipation because certain hormones are causing the muscles that push stool through your intestines to slow down.  You may develop hemorrhoids or swollen veins (varicose veins).  Your breasts may begin to grow larger and become tender. Your nipples may stick out more, and the tissue that surrounds them (areola) may become darker.  Your gums may bleed and may be  sensitive to brushing and flossing.  Dark spots or blotches (chloasma, mask of pregnancy) may develop on your face. This will likely fade after the baby is born.  Your menstrual periods will stop.  You may have a loss of appetite.  You may develop cravings for certain kinds of food.  You may have changes in your emotions from day to day, such as being excited to be pregnant or being concerned that something may go wrong with the pregnancy and baby.  You may have more vivid and strange dreams.  You may have changes in your hair. These can include thickening of your hair, rapid growth, and changes in texture. Some women also have hair loss during or after pregnancy, or hair that feels dry or thin. Your hair will most likely return to normal after your baby is born.  What to expect at prenatal visits During a routine prenatal visit:  You will be weighed to make sure you and the baby are growing normally.  Your blood pressure will be taken.  Your abdomen will be measured to track your baby's growth.  The fetal heartbeat will be listened to between weeks 10 and 14 of your pregnancy.  Test results from any previous visits will be discussed.  Your health care provider may ask you:  How you are feeling.  If you are feeling the baby move.  If you have had any abnormal symptoms, such as leaking fluid, bleeding, severe headaches,   or abdominal cramping.  If you are using any tobacco products, including cigarettes, chewing tobacco, and electronic cigarettes.  If you have any questions.  Other tests that may be performed during your first trimester include:  Blood tests to find your blood type and to check for the presence of any previous infections. The tests will also be used to check for low iron levels (anemia) and protein on red blood cells (Rh antibodies). Depending on your risk factors, or if you previously had diabetes during pregnancy, you may have tests to check for high blood  sugar that affects pregnant women (gestational diabetes).  Urine tests to check for infections, diabetes, or protein in the urine.  An ultrasound to confirm the proper growth and development of the baby.  Fetal screens for spinal cord problems (spina bifida) and Down syndrome.  HIV (human immunodeficiency virus) testing. Routine prenatal testing includes screening for HIV, unless you choose not to have this test.  You may need other tests to make sure you and the baby are doing well.  Follow these instructions at home: Medicines  Follow your health care provider's instructions regarding medicine use. Specific medicines may be either safe or unsafe to take during pregnancy.  Take a prenatal vitamin that contains at least 600 micrograms (mcg) of folic acid.  If you develop constipation, try taking a stool softener if your health care provider approves. Eating and drinking  Eat a balanced diet that includes fresh fruits and vegetables, whole grains, good sources of protein such as meat, eggs, or tofu, and low-fat dairy. Your health care provider will help you determine the amount of weight gain that is right for you.  Avoid raw meat and uncooked cheese. These carry germs that can cause birth defects in the baby.  Eating four or five small meals rather than three large meals a day may help relieve nausea and vomiting. If you start to feel nauseous, eating a few soda crackers can be helpful. Drinking liquids between meals, instead of during meals, also seems to help ease nausea and vomiting.  Limit foods that are high in fat and processed sugars, such as fried and sweet foods.  To prevent constipation: ? Eat foods that are high in fiber, such as fresh fruits and vegetables, whole grains, and beans. ? Drink enough fluid to keep your urine clear or pale yellow. Activity  Exercise only as directed by your health care provider. Most women can continue their usual exercise routine during  pregnancy. Try to exercise for 30 minutes at least 5 days a week. Exercising will help you: ? Control your weight. ? Stay in shape. ? Be prepared for labor and delivery.  Experiencing pain or cramping in the lower abdomen or lower back is a good sign that you should stop exercising. Check with your health care provider before continuing with normal exercises.  Try to avoid standing for long periods of time. Move your legs often if you must stand in one place for a long time.  Avoid heavy lifting.  Wear low-heeled shoes and practice good posture.  You may continue to have sex unless your health care provider tells you not to. Relieving pain and discomfort  Wear a good support bra to relieve breast tenderness.  Take warm sitz baths to soothe any pain or discomfort caused by hemorrhoids. Use hemorrhoid cream if your health care provider approves.  Rest with your legs elevated if you have leg cramps or low back pain.  If you develop   varicose veins in your legs, wear support hose. Elevate your feet for 15 minutes, 3-4 times a day. Limit salt in your diet. Prenatal care  Schedule your prenatal visits by the twelfth week of pregnancy. They are usually scheduled monthly at first, then more often in the last 2 months before delivery.  Write down your questions. Take them to your prenatal visits.  Keep all your prenatal visits as told by your health care provider. This is important. Safety  Wear your seat belt at all times when driving.  Make a list of emergency phone numbers, including numbers for family, friends, the hospital, and police and fire departments. General instructions  Ask your health care provider for a referral to a local prenatal education class. Begin classes no later than the beginning of month 6 of your pregnancy.  Ask for help if you have counseling or nutritional needs during pregnancy. Your health care provider can offer advice or refer you to specialists for help  with various needs.  Do not use hot tubs, steam rooms, or saunas.  Do not douche or use tampons or scented sanitary pads.  Do not cross your legs for long periods of time.  Avoid cat litter boxes and soil used by cats. These carry germs that can cause birth defects in the baby and possibly loss of the fetus by miscarriage or stillbirth.  Avoid all smoking, herbs, alcohol, and medicines not prescribed by your health care provider. Chemicals in these products affect the formation and growth of the baby.  Do not use any products that contain nicotine or tobacco, such as cigarettes and e-cigarettes. If you need help quitting, ask your health care provider. You may receive counseling support and other resources to help you quit.  Schedule a dentist appointment. At home, brush your teeth with a soft toothbrush and be gentle when you floss. Contact a health care provider if:  You have dizziness.  You have mild pelvic cramps, pelvic pressure, or nagging pain in the abdominal area.  You have persistent nausea, vomiting, or diarrhea.  You have a bad smelling vaginal discharge.  You have pain when you urinate.  You notice increased swelling in your face, hands, legs, or ankles.  You are exposed to fifth disease or chickenpox.  You are exposed to German measles (rubella) and have never had it. Get help right away if:  You have a fever.  You are leaking fluid from your vagina.  You have spotting or bleeding from your vagina.  You have severe abdominal cramping or pain.  You have rapid weight gain or loss.  You vomit blood or material that looks like coffee grounds.  You develop a severe headache.  You have shortness of breath.  You have any kind of trauma, such as from a fall or a car accident. Summary  The first trimester of pregnancy is from week 1 until the end of week 13 (months 1 through 3).  Your body goes through many changes during pregnancy. The changes vary from  woman to woman.  You will have routine prenatal visits. During those visits, your health care provider will examine you, discuss any test results you may have, and talk with you about how you are feeling. This information is not intended to replace advice given to you by your health care provider. Make sure you discuss any questions you have with your health care provider. Document Released: 12/26/2000 Document Revised: 12/14/2015 Document Reviewed: 12/14/2015 Elsevier Interactive Patient Education  2017 Elsevier   Inc.  

## 2016-05-31 ENCOUNTER — Telehealth: Payer: Self-pay

## 2016-05-31 MED ORDER — METRONIDAZOLE 500 MG PO TABS: 500.0000 mg | ORAL_TABLET | Freq: Two times a day (BID) | ORAL | 0 refills | Status: DC

## 2016-05-31 NOTE — Telephone Encounter (Signed)
Patient called stating she thinks she has BV> patient has a discharge with fishy odor and is [redacted] weeks pregnant.  Patient would like a script called to [facility] [place]. Patient states she mentioned it at her New OB appointment yesterday.

## 2016-06-05 LAB — CYTOLOGY - PAP
DIAGNOSIS: NEGATIVE
HPV: DETECTED — AB

## 2016-06-06 ENCOUNTER — Telehealth: Payer: Self-pay

## 2016-06-06 NOTE — Telephone Encounter (Signed)
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 Patient made aware that it will be important for her repeat her pap smear next year. Patient states understanding. Kathrene Alu RNBSN

## 2016-06-06 NOTE — Telephone Encounter (Signed)
-----   Message from Lavonia Drafts, MD sent at 06/05/2016 12:46 PM EDT ----- Please call pt. Her PAP was neg but, her hrHPV was +. She will need a repeat PAP in 1 year.  Thx clh-S

## 2016-06-07 ENCOUNTER — Ambulatory Visit (HOSPITAL_COMMUNITY)
Admission: RE | Admit: 2016-06-07 | Discharge: 2016-06-07 | Disposition: A | Discharge status: Home / Self Care | Payer: Medicaid Other | Source: Ambulatory Visit | Attending: Obstetrics & Gynecology | Admitting: Obstetrics & Gynecology

## 2016-06-07 ENCOUNTER — Encounter (HOSPITAL_COMMUNITY): Payer: Self-pay

## 2016-06-07 ENCOUNTER — Other Ambulatory Visit: Payer: Self-pay | Admitting: Obstetrics & Gynecology

## 2016-06-07 DIAGNOSIS — Z3491 Encounter for supervision of normal pregnancy, unspecified, first trimester: Secondary | ICD-10-CM

## 2016-06-07 DIAGNOSIS — O34219 Maternal care for unspecified type scar from previous cesarean delivery: Secondary | ICD-10-CM

## 2016-06-07 DIAGNOSIS — O09529 Supervision of elderly multigravida, unspecified trimester: Secondary | ICD-10-CM

## 2016-06-07 DIAGNOSIS — Z3A13 13 weeks gestation of pregnancy: Secondary | ICD-10-CM | POA: Insufficient documentation

## 2016-06-07 DIAGNOSIS — Z3480 Encounter for supervision of other normal pregnancy, unspecified trimester: Secondary | ICD-10-CM

## 2016-06-12 LAB — INHERITEST SOCIETY GUIDED

## 2016-06-12 LAB — OBSTETRIC PANEL, INCLUDING HIV
Antibody Screen: NEGATIVE
BASOS ABS: 0 10*3/uL (ref 0.0–0.2)
Basos: 0 %
EOS (ABSOLUTE): 0.3 10*3/uL (ref 0.0–0.4)
Eos: 3 %
HEP B S AG: NEGATIVE
HIV Screen 4th Generation wRfx: NONREACTIVE
Hematocrit: 33.4 % — ABNORMAL LOW (ref 34.0–46.6)
Hemoglobin: 11.4 g/dL (ref 11.1–15.9)
IMMATURE GRANS (ABS): 0 10*3/uL (ref 0.0–0.1)
Immature Granulocytes: 0 %
Lymphocytes Absolute: 1.8 10*3/uL (ref 0.7–3.1)
Lymphs: 19 %
MCH: 30.4 pg (ref 26.6–33.0)
MCHC: 34.1 g/dL (ref 31.5–35.7)
MCV: 89 fL (ref 79–97)
MONOCYTES: 5 %
Monocytes Absolute: 0.5 10*3/uL (ref 0.1–0.9)
Neutrophils Absolute: 6.8 10*3/uL (ref 1.4–7.0)
Neutrophils: 73 %
PLATELETS: 181 10*3/uL (ref 150–379)
RBC: 3.75 x10E6/uL — ABNORMAL LOW (ref 3.77–5.28)
RDW: 13.7 % (ref 12.3–15.4)
RPR: NONREACTIVE
RUBELLA: 1.6 {index} (ref >0.99)
Rh Factor: POSITIVE
WBC: 9.4 10*3/uL (ref 3.4–10.8)

## 2016-06-13 ENCOUNTER — Ambulatory Visit (INDEPENDENT_AMBULATORY_CARE_PROVIDER_SITE_OTHER): Payer: Medicaid Other | Admitting: Obstetrics & Gynecology

## 2016-06-13 VITALS — BP 125/79 | HR 92 | Wt 170.0 lb

## 2016-06-13 DIAGNOSIS — O34219 Maternal care for unspecified type scar from previous cesarean delivery: Secondary | ICD-10-CM

## 2016-06-13 DIAGNOSIS — O09523 Supervision of elderly multigravida, third trimester: Secondary | ICD-10-CM | POA: Diagnosis not present

## 2016-06-13 DIAGNOSIS — Z3480 Encounter for supervision of other normal pregnancy, unspecified trimester: Secondary | ICD-10-CM

## 2016-06-13 NOTE — Progress Notes (Signed)
Pt came in to discuss dating [place]. She reports that she went to MFM for [place] and first trimester screen and was told that she her 'dates were wrong and that her docs needed to change her dates' she reports that she left there feeling frustrated and confused.    She is here to review her dates. She thought her dates were 3-4 weeks off based on her conversation with the tech.  I reviewed all of her [place] (even the dating [place] that I did myself at her initial visit).  All of the dates were within 5 days of her LMP and I explained the process of 1st trimester dating.     She also had other questions regarding her PNL. I reviewed hrHPV and her PAP results.  She will need a repeat PAP in 1 year.  Total face-to-face time with patient was 15 min.  Greater than 50% was spent in counseling and coordination of care with the patient.   Timotheus Salm L. Harraway-Smith, M.D., Cherlynn June

## 2016-06-29 ENCOUNTER — Telehealth: Payer: Self-pay

## 2016-06-29 NOTE — Telephone Encounter (Signed)
Patient called stating she has been having some nauseathat is now occurring in the middle of the day. Patient instructed to add Vitamin B6 to her medications and see if it helps. Kathrene Alu RNBSN

## 2016-07-16 ENCOUNTER — Encounter (HOSPITAL_COMMUNITY): Payer: Self-pay

## 2016-07-16 ENCOUNTER — Ambulatory Visit (HOSPITAL_COMMUNITY)
Admission: RE | Admit: 2016-07-16 | Discharge: 2016-07-16 | Disposition: A | Discharge status: Home / Self Care | Payer: Medicaid Other | Source: Ambulatory Visit | Attending: Obstetrics & Gynecology | Admitting: Obstetrics & Gynecology

## 2016-07-16 ENCOUNTER — Other Ambulatory Visit: Payer: Self-pay | Admitting: Obstetrics & Gynecology

## 2016-07-16 ENCOUNTER — Other Ambulatory Visit (HOSPITAL_COMMUNITY): Payer: Self-pay | Admitting: *Deleted

## 2016-07-16 DIAGNOSIS — Z3A19 19 weeks gestation of pregnancy: Secondary | ICD-10-CM

## 2016-07-16 DIAGNOSIS — Z3689 Encounter for other specified antenatal screening: Secondary | ICD-10-CM | POA: Diagnosis present

## 2016-07-16 DIAGNOSIS — O09522 Supervision of elderly multigravida, second trimester: Secondary | ICD-10-CM

## 2016-07-16 DIAGNOSIS — O34219 Maternal care for unspecified type scar from previous cesarean delivery: Secondary | ICD-10-CM | POA: Insufficient documentation

## 2016-07-16 DIAGNOSIS — Z3480 Encounter for supervision of other normal pregnancy, unspecified trimester: Secondary | ICD-10-CM

## 2016-07-16 DIAGNOSIS — O99212 Obesity complicating pregnancy, second trimester: Secondary | ICD-10-CM | POA: Insufficient documentation

## 2016-07-17 NOTE — Addendum Note (Signed)
Encounter addended by: Lavonia Drafts, MD on: 07/17/2016 11:27 AM<BR>    Actions taken: Problem List modified

## 2016-07-30 ENCOUNTER — Ambulatory Visit (INDEPENDENT_AMBULATORY_CARE_PROVIDER_SITE_OTHER): Payer: Medicaid Other | Admitting: Obstetrics & Gynecology

## 2016-07-30 VITALS — BP 110/58 | HR 77 | Wt 175.0 lb

## 2016-07-30 DIAGNOSIS — Z3482 Encounter for supervision of other normal pregnancy, second trimester: Secondary | ICD-10-CM

## 2016-07-30 DIAGNOSIS — Z3480 Encounter for supervision of other normal pregnancy, unspecified trimester: Secondary | ICD-10-CM

## 2016-07-30 DIAGNOSIS — O09522 Supervision of elderly multigravida, second trimester: Secondary | ICD-10-CM

## 2016-07-30 DIAGNOSIS — J3089 Other allergic rhinitis: Secondary | ICD-10-CM

## 2016-07-30 DIAGNOSIS — O34219 Maternal care for unspecified type scar from previous cesarean delivery: Secondary | ICD-10-CM

## 2016-07-30 NOTE — Patient Instructions (Signed)

## 2016-07-30 NOTE — Progress Notes (Signed)
   PRENATAL VISIT NOTE  Subjective:  Jacqueline [name] is a 36 y.o. [phone] at [redacted]w[redacted]d being seen today for ongoing prenatal care.  She is currently monitored for the following issues for this low-risk pregnancy and has Supervision of other normal pregnancy, antepartum; Advanced maternal age in multigravida; Previous cesarean delivery, antepartum; and Seasonal allergies on her problem list.  Patient reports no complaints and fatigue.  Contractions: Not present. Vag. Bleeding: None.  Movement: Present. Denies leaking of fluid.   The following portions of the patient's history were reviewed and updated as appropriate: allergies, current medications, past family history, past medical history, past social history, past surgical history and problem list. Problem list updated.  Objective:   Vitals:   07/30/16 0847  BP: (!) 110/58  Pulse: 77  Weight: 175 lb (79.4 kg)    Fetal Status: Fetal Heart Rate (bpm): 148   Movement: Present     General:  Alert, oriented and cooperative. Patient is in no acute distress.  Skin: Skin is warm and dry. No rash noted.   Cardiovascular: Normal heart rate noted  Respiratory: Normal respiratory effort, no problems with respiration noted  Abdomen: Soft, gravid, appropriate for gestational age. Pain/Pressure: Absent     Pelvic:  Cervical exam deferred        Extremities: Normal range of motion.  Edema: None  Mental Status: Normal mood and affect. Normal behavior. Normal judgment and thought content.   Assessment and Plan:  Pregnancy: G4P1021 at [redacted]w[redacted]d  1. Supervision of other normal pregnancy, antepartum  2. Previous cesarean delivery, antepartum Repeat c/s vs VBAC counseling. Need  Records from [facility] in 2006. Has a c-sectio with Dr. Cletis Media.     3. Elderly multigravida in second trimester F/u US in 7 weeks  4. Seasonal allergic rhinitis due to other allergic trigger Increase Flonase to 2 puff in each nostril bid  Preterm labor symptoms and  general obstetric precautions including but not limited to vaginal bleeding, contractions, leaking of fluid and fetal movement were reviewed in detail with the patient. Please refer to After Visit Summary for other counseling recommendations.  Return in about 7 weeks (around 09/17/2016).   Lavonia Drafts, MD

## 2016-08-07 LAB — AFP TETRA
DIA Mom Value: 0.77
DIA VALUE (EIA): 159.69 pg/mL
DSR (By Age)    1 IN: 298
DSR (SECOND TRIMESTER) 1 IN: 4860
GESTATIONAL AGE AFP: 21.1 wk
MSAFP MOM: 0.76
MSAFP: 49 ng/mL
MSHCG Mom: 0.67
MSHCG: 13854 m[IU]/mL
Maternal Age At EDD: 35 yr
OSB RISK: 10000
T18 (By Age): 1:1162 {titer}
Test Results:: NEGATIVE
UE3 MOM: 1.25
WEIGHT: 175 [lb_av]
uE3 Value: 2.51 ng/mL

## 2016-08-13 ENCOUNTER — Ambulatory Visit (HOSPITAL_COMMUNITY)
Admission: RE | Admit: 2016-08-13 | Discharge: 2016-08-13 | Disposition: A | Discharge status: Home / Self Care | Payer: Medicaid Other | Source: Ambulatory Visit | Attending: Obstetrics & Gynecology | Admitting: Obstetrics & Gynecology

## 2016-08-13 ENCOUNTER — Encounter (HOSPITAL_COMMUNITY): Payer: Self-pay

## 2016-09-13 ENCOUNTER — Ambulatory Visit (INDEPENDENT_AMBULATORY_CARE_PROVIDER_SITE_OTHER): Payer: Medicaid Other | Admitting: Obstetrics & Gynecology

## 2016-09-13 DIAGNOSIS — Z3480 Encounter for supervision of other normal pregnancy, unspecified trimester: Secondary | ICD-10-CM

## 2016-09-13 NOTE — Progress Notes (Addendum)
   PRENATAL VISIT NOTE  Subjective:  Jacqueline [name] is a 36 y.o. AA [id number] at [redacted]w[redacted]d being seen today for ongoing prenatal care.  She is currently monitored for the following issues for this low-risk pregnancy and has Supervision of other normal pregnancy, antepartum; Advanced maternal age in multigravida; Previous cesarean delivery, antepartum; and Seasonal allergies on her problem list.  Patient reports no complaints.  Contractions: Not present. Vag. Bleeding: None.  Movement: Present. Denies leaking of fluid.   The following portions of the patient's history were reviewed and updated as appropriate: allergies, current medications, past family history, past medical history, past social history, past surgical history and problem list. Problem list updated.  Objective:   Vitals:   09/13/16 0841  BP: 113/68  Pulse: 85  Weight: 178 lb (80.7 kg)    Fetal Status:     Movement: Present     General:  Alert, oriented and cooperative. Patient is in no acute distress.  Skin: Skin is warm and dry. No rash noted.   Cardiovascular: Normal heart rate noted  Respiratory: Normal respiratory effort, no problems with respiration noted  Abdomen: Soft, gravid, appropriate for gestational age.  Pain/Pressure: Absent     Pelvic: Cervical exam deferred        Extremities: Normal range of motion.  Edema: None  Mental Status:  Normal mood and affect. Normal behavior. Normal judgment and thought content.   Assessment and Plan:  Pregnancy: G4P1021 at [redacted]w[redacted]d  1. Supervision of other normal pregnancy, antepartum  - [place] MFM OB FOLLOW UP; Future - She cannot stay for labs today and will RTC this Wednesday for labs, TDAP  Preterm labor symptoms and general obstetric precautions including but not limited to vaginal bleeding, contractions, leaking of fluid and fetal movement were reviewed in detail with the patient. Please refer to After Visit Summary for other counseling recommendations.  No Follow-up  on file.   Emily Filbert, MD

## 2016-09-13 NOTE — Progress Notes (Signed)
Patient will do 2 hr gtt next Wednesday. Kathrene Alu RNBSN

## 2016-09-14 ENCOUNTER — Encounter: Payer: Medicaid Other | Admitting: Family Medicine

## 2016-09-19 ENCOUNTER — Other Ambulatory Visit: Payer: Self-pay | Admitting: Family Medicine

## 2016-09-19 ENCOUNTER — Other Ambulatory Visit (INDEPENDENT_AMBULATORY_CARE_PROVIDER_SITE_OTHER): Payer: Medicaid Other

## 2016-09-19 DIAGNOSIS — Z23 Encounter for immunization: Secondary | ICD-10-CM

## 2016-09-19 DIAGNOSIS — Z3490 Encounter for supervision of normal pregnancy, unspecified, unspecified trimester: Secondary | ICD-10-CM

## 2016-09-19 NOTE — Progress Notes (Signed)
Patient here for  Tdap injection and 28 week labs. Kathrene Alu RNBSN

## 2016-09-20 ENCOUNTER — Encounter (HOSPITAL_COMMUNITY): Payer: Self-pay

## 2016-09-20 ENCOUNTER — Other Ambulatory Visit: Payer: Self-pay | Admitting: Obstetrics & Gynecology

## 2016-09-20 ENCOUNTER — Ambulatory Visit (HOSPITAL_COMMUNITY)
Admission: RE | Admit: 2016-09-20 | Discharge: 2016-09-20 | Disposition: A | Discharge status: Home / Self Care | Payer: Medicaid Other | Source: Ambulatory Visit | Attending: Obstetrics & Gynecology | Admitting: Obstetrics & Gynecology

## 2016-09-20 DIAGNOSIS — Z3A28 28 weeks gestation of pregnancy: Secondary | ICD-10-CM

## 2016-09-20 DIAGNOSIS — Z3480 Encounter for supervision of other normal pregnancy, unspecified trimester: Secondary | ICD-10-CM

## 2016-09-20 DIAGNOSIS — Z362 Encounter for other antenatal screening follow-up: Secondary | ICD-10-CM

## 2016-09-20 DIAGNOSIS — O09523 Supervision of elderly multigravida, third trimester: Secondary | ICD-10-CM

## 2016-09-20 DIAGNOSIS — O09522 Supervision of elderly multigravida, second trimester: Secondary | ICD-10-CM

## 2016-09-20 DIAGNOSIS — O99213 Obesity complicating pregnancy, third trimester: Secondary | ICD-10-CM

## 2016-09-20 DIAGNOSIS — O3413 Maternal care for benign tumor of corpus uteri, third trimester: Secondary | ICD-10-CM | POA: Insufficient documentation

## 2016-09-20 DIAGNOSIS — O34219 Maternal care for unspecified type scar from previous cesarean delivery: Secondary | ICD-10-CM

## 2016-09-20 LAB — CBC
Hematocrit: 32.9 % — ABNORMAL LOW (ref 34.0–46.6)
Hemoglobin: 11 g/dL — ABNORMAL LOW (ref 11.1–15.9)
MCH: 30.1 pg (ref 26.6–33.0)
MCHC: 33.4 g/dL (ref 31.5–35.7)
MCV: 90 fL (ref 79–97)
PLATELETS: 141 10*3/uL — AB (ref 150–379)
RBC: 3.66 x10E6/uL — AB (ref 3.77–5.28)
RDW: 14.3 % (ref 12.3–15.4)
WBC: 10 10*3/uL (ref 3.4–10.8)

## 2016-09-20 LAB — RPR: RPR Ser Ql: NONREACTIVE

## 2016-09-20 LAB — GLUCOSE TOLERANCE, 2 HOURS W/ 1HR
GLUCOSE, 1 HOUR: 154 mg/dL (ref 65–179)
GLUCOSE, 2 HOUR: 157 mg/dL — AB (ref 65–152)
GLUCOSE, FASTING: 90 mg/dL (ref 65–91)

## 2016-09-20 LAB — HIV ANTIBODY (ROUTINE TESTING W REFLEX): HIV Screen 4th Generation wRfx: NONREACTIVE

## 2016-09-25 ENCOUNTER — Other Ambulatory Visit: Payer: Self-pay | Admitting: Family Medicine

## 2016-09-25 MED ORDER — GLUCOSE BLOOD VI STRP: ORAL_STRIP | 12 refills | Status: DC

## 2016-09-25 MED ORDER — ACCU-CHEK FASTCLIX LANCETS MISC: 1.0000 [IU] | Freq: Four times a day (QID) | 12 refills | Status: DC

## 2016-09-25 MED ORDER — ACCU-CHEK NANO SMARTVIEW W/DEVICE KIT
1.0000 | PACK | 0 refills | Status: DC
Start: 2016-09-25 — End: 2016-11-16

## 2016-09-28 ENCOUNTER — Telehealth: Payer: Self-pay

## 2016-09-28 DIAGNOSIS — R7309 Other abnormal glucose: Secondary | ICD-10-CM

## 2016-09-28 NOTE — Telephone Encounter (Signed)
Patient called and made aware that she failed her two hour glucose tolerance test and will be referred to diabetes management for education. Referral placed. Kathrene Alu RNBSN

## 2016-09-28 NOTE — Telephone Encounter (Signed)
-----   Message from Truett Mainland, DO sent at 09/25/2016  2:07 PM EDT ----- Elevated 2hr GTT. Needs to be referred for DM teaching. Supplies sent to pharmacy. Will need followup sooner than 32 weeks

## 2016-10-01 ENCOUNTER — Ambulatory Visit (INDEPENDENT_AMBULATORY_CARE_PROVIDER_SITE_OTHER): Payer: Medicaid Other | Admitting: Family Medicine

## 2016-10-01 VITALS — BP 126/68 | HR 79 | Wt 178.0 lb

## 2016-10-01 DIAGNOSIS — Z3480 Encounter for supervision of other normal pregnancy, unspecified trimester: Secondary | ICD-10-CM

## 2016-10-01 DIAGNOSIS — O34219 Maternal care for unspecified type scar from previous cesarean delivery: Secondary | ICD-10-CM

## 2016-10-01 DIAGNOSIS — O24419 Gestational diabetes mellitus in pregnancy, unspecified control: Secondary | ICD-10-CM

## 2016-10-01 NOTE — Progress Notes (Signed)
   PRENATAL VISIT NOTE  Subjective:  Jacqueline [name] is a 36 y.o. [phone] at [redacted]w[redacted]d being seen today for ongoing prenatal care.  She is currently monitored for the following issues for this high-risk pregnancy and has Supervision of other normal pregnancy, antepartum; Advanced maternal age in multigravida; Previous cesarean delivery, antepartum; and Seasonal allergies on her problem list.  Patient reports no complaints.  Contractions: Not present. Vag. Bleeding: None.  Movement: Present. Denies leaking of fluid.   The following portions of the patient's history were reviewed and updated as appropriate: allergies, current medications, past family history, past medical history, past social history, past surgical history and problem list. Problem list updated.  Objective:   Vitals:   10/01/16 0837  BP: 126/68  Pulse: 79  Weight: 178 lb (80.7 kg)    Fetal Status: Fetal Heart Rate (bpm): 150   Movement: Present     General:  Alert, oriented and cooperative. Patient is in no acute distress.  Skin: Skin is warm and dry. No rash noted.   Cardiovascular: Normal heart rate noted  Respiratory: Normal respiratory effort, no problems with respiration noted  Abdomen: Soft, gravid, appropriate for gestational age.  Pain/Pressure: Absent     Pelvic: Cervical exam deferred        Extremities: Normal range of motion.  Edema: None  Mental Status:  Normal mood and affect. Normal behavior. Normal judgment and thought content.   Assessment and Plan:  Pregnancy: G4P1021 at [redacted]w[redacted]d  1. Supervision of other normal pregnancy, antepartum FHT and FH normal  2. Previous cesarean delivery, antepartum TOLAC paperwork signed. Discussed risks and benefits.  3. Gestational diabetes mellitus (GDM) in third trimester, gestational diabetes method of control unspecified Discussed goals. Pt to start checking.  Preterm labor symptoms and general obstetric precautions including but not limited to vaginal  bleeding, contractions, leaking of fluid and fetal movement were reviewed in detail with the patient. Please refer to After Visit Summary for other counseling recommendations.  Return in about 2 weeks (around 10/15/2016) for HR OB f/u.   Truett Mainland, DO

## 2016-10-03 ENCOUNTER — Encounter: Payer: Medicaid Other | Attending: Family Medicine | Admitting: *Deleted

## 2016-10-03 DIAGNOSIS — R7309 Other abnormal glucose: Secondary | ICD-10-CM

## 2016-10-03 DIAGNOSIS — O99810 Abnormal glucose complicating pregnancy: Secondary | ICD-10-CM | POA: Insufficient documentation

## 2016-10-03 DIAGNOSIS — Z713 Dietary counseling and surveillance: Secondary | ICD-10-CM | POA: Insufficient documentation

## 2016-10-03 NOTE — Progress Notes (Signed)
  Patient was seen on 10/03/2016 for Gestational Diabetes self-management . Patient states no history of GDM, she already has her meter and has tested the past 2 days. 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  The following learning objectives were met by the patient :   States the definition of Gestational Diabetes  States why dietary management is important in controlling blood glucose  Describes the effects of carbohydrates on blood glucose levels  Demonstrates ability to create a balanced meal plan  Demonstrates carbohydrate counting   States when to check blood glucose levels  Demonstrates proper blood glucose monitoring techniques  States the effect of stress and exercise on blood glucose levels  States the importance of limiting caffeine and abstaining from alcohol and smoking  Plan:  Aim for 3 Carb Choices per meal (45 grams) +/- 1 either way  Aim for 1-2 Carbs per snack Begin reading food labels for Total Carbohydrate of foods Consider  increasing your activity level by walking or other activity daily as tolerated Begin checking BG before breakfast and 2 hours after first bite of breakfast, lunch and dinner as directed by MD  Bring Log Book to every medical appointment   Take medication if directed by MD  Patient already has a meter: Accu Chek Guide with Fast clix drums And is testing pre breakfast and 2 hours each meal as directed by MD Review of Log Book shows: FBG in low 100's, 2 hour post meal within target ranges  Patient instructed to monitor glucose levels: FBS: 60 - 95 mg/dl 2 hour: <120 mg/dl  Patient received the following handouts:  Nutrition Diabetes and Pregnancy  Carbohydrate Counting List  Patient will be seen for follow-up as needed.

## 2016-10-05 ENCOUNTER — Encounter (HOSPITAL_BASED_OUTPATIENT_CLINIC_OR_DEPARTMENT_OTHER): Payer: Self-pay | Admitting: Adult Health

## 2016-10-05 DIAGNOSIS — R600 Localized edema: Secondary | ICD-10-CM | POA: Diagnosis present

## 2016-10-05 DIAGNOSIS — K648 Other hemorrhoids: Secondary | ICD-10-CM | POA: Diagnosis not present

## 2016-10-05 DIAGNOSIS — Z79899 Other long term (current) drug therapy: Secondary | ICD-10-CM | POA: Insufficient documentation

## 2016-10-05 NOTE — ED Triage Notes (Signed)
Presents with red, sore and indurated area to rectum that began today. She denies fevers, endorses pain. Pt is 7 months pregnant.

## 2016-10-06 ENCOUNTER — Emergency Department (HOSPITAL_BASED_OUTPATIENT_CLINIC_OR_DEPARTMENT_OTHER)
Admission: EM | Admit: 2016-10-06 | Discharge: 2016-10-06 | Disposition: A | Discharge status: Home / Self Care | Payer: Medicaid Other | Attending: Emergency Medicine | Admitting: Emergency Medicine

## 2016-10-06 ENCOUNTER — Encounter (HOSPITAL_BASED_OUTPATIENT_CLINIC_OR_DEPARTMENT_OTHER): Payer: Self-pay | Admitting: Emergency Medicine

## 2016-10-06 DIAGNOSIS — K648 Other hemorrhoids: Secondary | ICD-10-CM

## 2016-10-06 NOTE — ED Provider Notes (Signed)
[facility]PT [facility] Provider Note   CSN: [id number] Arrival date & time: 10/05/16  2250     History   Chief Complaint Chief Complaint  Patient presents with  . Abscess    HPI Jacqueline [name] is a 36 y.o. female.  The history is provided by the patient. No language interpreter was used.  Abscess  Location:  Pelvis Pelvic abscess location:  Anus Abscess quality: itching and painful   Red streaking: no   Progression:  Unchanged Pain details:    Quality:  Throbbing   Severity:  Moderate   Timing:  Constant   Progression:  Unchanged Chronicity:  New Context: not skin injury   Relieved by:  Nothing Worsened by:  Nothing Ineffective treatments:  None tried Associated symptoms: no anorexia and no fever   Risk factors: no family hx of MRSA     Past Medical History:  Diagnosis Date  . Fibroid     Patient Active Problem List   Diagnosis Date Noted  . Supervision of other normal pregnancy, antepartum 05/30/2016  . Advanced maternal age in multigravida 05/30/2016  . Previous cesarean delivery, antepartum 05/30/2016  . Seasonal allergies 05/30/2016    Past Surgical History:  Procedure Laterality Date  . CESAREAN SECTION      OB History    Gravida Para Term Preterm AB Living   '4 1 1   2 1   ' SAB TAB Ectopic Multiple Live Births   2       1       Home Medications    Prior to Admission medications   Medication Sig Start Date End Date Taking? Authorizing Provider  ACCU-CHEK FASTCLIX LANCETS MISC 1 Units by Percutaneous route 4 (four) times daily. 09/25/16   Truett Mainland, DO  Blood Glucose Monitoring Suppl (ACCU-CHEK NANO SMARTVIEW) w/Device KIT 1 kit by Subdermal route as directed. Check blood sugars for fasting, and two hours after breakfast, lunch and dinner (4 checks daily) 09/25/16   Truett Mainland, DO  cetirizine (ZYRTEC) 10 MG tablet Take 10 mg by mouth daily.    [provider]  fluticasone (FLONASE) 50 MCG/ACT nasal spray Place  into both nostrils daily.    [provider]  glucose blood (ACCU-CHEK SMARTVIEW) test strip Use as instructed to check blood sugars 09/25/16   Truett Mainland, DO  metroNIDAZOLE (FLAGYL) 500 MG tablet Take 1 tablet (500 mg total) by mouth 2 (two) times daily. Patient not taking: Reported on 06/07/2016 05/31/16   Lavonia Drafts, MD  mometasone (NASONEX) 50 MCG/ACT nasal spray Place 1 spray into the nose daily.    [provider]  Prenatal Vit-Fe Fumarate-FA (MULTIVITAMIN-PRENATAL) 27-0.8 MG TABS tablet Take 1 tablet by mouth daily at 12 noon.    [provider]    Family History Family History  Problem Relation Age of Onset  . Hyperlipidemia Mother   . Stroke Brother   . Hypertension Maternal Grandmother     Social History Social History  Substance Use Topics  . Smoking status: Never Smoker  . Smokeless tobacco: Never Used  . Alcohol use No     Allergies   Penicillins   Review of Systems Review of Systems  Constitutional: Negative for fever.  Respiratory: Negative for shortness of breath.   Gastrointestinal: Negative for abdominal pain and anorexia.  Genitourinary: Negative for pelvic pain.  All other systems reviewed and are negative.    Physical Exam Updated Vital Signs BP (!) 125/91   Pulse 94  Temp 97.9 F (36.6 C) (Oral)   Resp 18   LMP 03/04/2016 (Exact Date)   SpO2 99%   Physical Exam  Constitutional: She is oriented to person, place, and time. She appears well-developed and well-nourished. No distress.  HENT:  Head: Normocephalic and atraumatic.  Mouth/Throat: No oropharyngeal exudate.  Eyes: Pupils are equal, round, and reactive to light. Conjunctivae are normal.  Neck: Normal range of motion. Neck supple.  Cardiovascular: Normal rate, regular rhythm, normal heart sounds and intact distal pulses.   Pulmonary/Chest: Effort normal and breath sounds normal. She has no wheezes. She has no rales.  Abdominal: Soft. Bowel  sounds are normal. She exhibits no mass. There is no tenderness. There is no rebound and no guarding.  Genitourinary:  Genitourinary Comments: Non thrombosed hemorrhoid  Musculoskeletal: Normal range of motion.  Neurological: She is alert and oriented to person, place, and time. She displays normal reflexes.  Skin: Skin is warm and dry. Capillary refill takes less than 2 seconds.  Psychiatric: She has a normal mood and affect.     ED Treatments / Results   Vitals:   10/05/16 2308 10/06/16 0133  BP: (!) 125/91 100/65  Pulse: 94 78  Resp: 18 18  Temp: 97.9 F (36.6 C)   SpO2: 99% 99%   Labs (all labs ordered are listed, but only abnormal results are displayed) Labs Reviewed - No data to display  EKG  EKG Interpretation None       Procedures Procedures (including critical care time)    Final Clinical Impressions(s) / ED Diagnoses  Sitz bath instructions and high fiber diet  Strict return precautions given for  Shortness of breath, swelling or the lips or tongue, chest pain, dyspnea on exertion, new weakness or numbness changes in vision or speech,  Inability to tolerate liquids or food, changes in voice cough, altered mental status or any concerns. No signs of systemic illness or infection. The patient is nontoxic-appearing on exam and vital signs are within normal limits.    I have reviewed the triage vital signs and the nursing notes. Pertinent labs &imaging results that were available during my care of the patient were reviewed by me and considered in my medical decision making (see chart for details).  After history, exam, and medical workup I feel the patient has been appropriately medically screened and is safe for discharge home. Pertinent diagnoses were discussed with the patient. Patient was given return precautions.      Hamlin Devine, MD 10/06/16 [phone]

## 2016-10-15 ENCOUNTER — Encounter: Payer: Medicaid Other | Admitting: Family Medicine

## 2016-10-16 ENCOUNTER — Ambulatory Visit (INDEPENDENT_AMBULATORY_CARE_PROVIDER_SITE_OTHER): Payer: Medicaid Other | Admitting: Advanced Practice Midwife

## 2016-10-16 ENCOUNTER — Encounter: Payer: Self-pay | Admitting: Advanced Practice Midwife

## 2016-10-16 ENCOUNTER — Other Ambulatory Visit: Payer: Self-pay | Admitting: Advanced Practice Midwife

## 2016-10-16 VITALS — BP 129/79 | HR 82 | Wt 173.0 lb

## 2016-10-16 DIAGNOSIS — O24415 Gestational diabetes mellitus in pregnancy, controlled by oral hypoglycemic drugs: Secondary | ICD-10-CM | POA: Insufficient documentation

## 2016-10-16 DIAGNOSIS — Z3480 Encounter for supervision of other normal pregnancy, unspecified trimester: Secondary | ICD-10-CM

## 2016-10-16 DIAGNOSIS — F411 Generalized anxiety disorder: Secondary | ICD-10-CM

## 2016-10-16 DIAGNOSIS — G4700 Insomnia, unspecified: Secondary | ICD-10-CM

## 2016-10-16 DIAGNOSIS — O09523 Supervision of elderly multigravida, third trimester: Secondary | ICD-10-CM

## 2016-10-16 DIAGNOSIS — O34219 Maternal care for unspecified type scar from previous cesarean delivery: Secondary | ICD-10-CM

## 2016-10-16 DIAGNOSIS — Z3A33 33 weeks gestation of pregnancy: Secondary | ICD-10-CM

## 2016-10-16 MED ORDER — SERTRALINE HCL 50 MG PO TABS: 50.0000 mg | ORAL_TABLET | Freq: Every day | ORAL | 3 refills | Status: DC

## 2016-10-16 MED ORDER — GLYBURIDE 1.25 MG PO TABS: 2.5000 mg | ORAL_TABLET | Freq: Two times a day (BID) | ORAL | 1 refills | Status: DC

## 2016-10-16 NOTE — Progress Notes (Signed)
Patient states she is having a hard time with maintaining a diabetic diet. Patient does not have her log book with her because she left that and her log at her job. Kathrene Alu RNBSN

## 2016-10-16 NOTE — Progress Notes (Signed)
PRENATAL VISIT NOTE  Subjective:  Jacqueline [name] is a 36 y.o. [phone] at [redacted]w[redacted]d being seen today for ongoing prenatal care.  She is currently monitored for the following issues for this high-risk pregnancy and has Supervision of other normal pregnancy, antepartum; Advanced maternal age in multigravida; Previous cesarean delivery, antepartum; Seasonal allergies; and Gestational diabetes mellitus (GDM) in third trimester controlled on oral hypoglycemic drug on her problem list.  Patient reports insomnia and mod to severe anxiety. Has anxiety attack work a few days ago. Was on meds in the past and thinks she need meds now. Does not have mental health care provider.  Contractions: Not present. Vag. Bleeding: None.  Movement: Present. Denies leaking of fluid.   Very worried about blood sugars being out of range despite trying hard to adhere to diabetic diet.   The following portions of the patient's history were reviewed and updated as appropriate: allergies, current medications, past family history, past medical history, past social history, past surgical history and problem list. Problem list updated.  Objective:   Vitals:   10/16/16 1016  BP: 129/79  Pulse: 82  Weight: 173 lb (78.5 kg)    Fetal Status: Fetal Heart Rate (bpm): 146 Fundal Height: 32 cm Movement: Present     General:  Alert, oriented and cooperative. Patient is in no acute distress.  Skin: Skin is warm and dry. No rash noted.   Cardiovascular: Normal heart rate noted  Respiratory: Normal respiratory effort, no problems with respiration noted  Abdomen: Soft, gravid, appropriate for gestational age.  Pain/Pressure: Present     Pelvic: Cervical exam deferred        Extremities: Normal range of motion.  Edema: None  Mental Status:  Normal mood and affect. Normal behavior. Normal judgment and thought content.   Left log book at work, but reports Fasting CBGs ~100's and PC CBGs > 50% >120.   Assessment and Plan:    Pregnancy: [id number] at [redacted]w[redacted]d  1. Elderly multigravida in third trimester  - [place] [facility] OB FOLLOW UP; Future  2. Previous cesarean delivery, antepartum - Planning TOLAC.  - Records requested - [place] [facility] FOLLOW UP; Future  3. Supervision of other normal pregnancy, antepartum  - glyBURIDE (DIABETA) 1.25 MG tablet; Take 2 tablets (2.5 mg total) by mouth 2 (two) times daily with a meal.  Dispense: 60 tablet; Refill: 1 - [place] [facility] FOLLOW UP; Future  4. Gestational diabetes mellitus (GDM) in third trimester controlled on oral hypoglycemic drug  - glyBURIDE (DIABETA) 1.25 MG tablet; Take 2 tablets (2.5 mg total) by mouth 2 (two) times daily with a meal.  Dispense: 60 tablet; Refill: 1 - [place] [facility] FOLLOW UP; Future  5. [redacted] weeks gestation of pregnancy  - [place] [facility] FOLLOW UP; Future  6. Insomnia, unspecified type - Discussed good sleep hygiene - May continue Tylenol PM or Unisom sparingly   - Ambulatory referral to [facility]  7. Generalized anxiety disorder - Support given.  - sertraline (ZOLOFT) 50 MG tablet; Take 1 tablet (50 mg total) by mouth daily.  Dispense: 30 tablet; Refill: 3 - Ambulatory referral to [facility]   Preterm labor symptoms and general obstetric precautions including but not limited to vaginal bleeding, contractions, leaking of fluid and fetal movement were reviewed in detail with the patient. Please refer to After Visit Summary for other counseling recommendations.  F/U 1 week Start antenatal testing. NST later this week due to lack of time today.  Manya Silvas, CNM

## 2016-10-17 ENCOUNTER — Other Ambulatory Visit: Payer: Self-pay | Admitting: Advanced Practice Midwife

## 2016-10-17 DIAGNOSIS — Z3480 Encounter for supervision of other normal pregnancy, unspecified trimester: Secondary | ICD-10-CM

## 2016-10-17 DIAGNOSIS — O24415 Gestational diabetes mellitus in pregnancy, controlled by oral hypoglycemic drugs: Secondary | ICD-10-CM

## 2016-10-17 MED ORDER — GLYBURIDE 1.25 MG PO TABS: 1.2500 mg | ORAL_TABLET | Freq: Two times a day (BID) | ORAL | 0 refills | Status: DC

## 2016-10-17 NOTE — Progress Notes (Signed)
Requesting 90 day supply.

## 2016-10-18 ENCOUNTER — Ambulatory Visit (INDEPENDENT_AMBULATORY_CARE_PROVIDER_SITE_OTHER): Payer: Medicaid Other | Admitting: Clinical

## 2016-10-18 DIAGNOSIS — F4322 Adjustment disorder with anxiety: Secondary | ICD-10-CM

## 2016-10-18 NOTE — BH Specialist Note (Signed)
Integrated Behavioral Health Initial Visit  MRN: [id number] Name: Jacqueline [name]  Number of [facility] Clinician visits:: 1/6 Session Start time: 3:15  Session End time: 4:00 Total time: 45 minutes  Type of Service: [place] Interpretor:No. Interpretor Name and Language: n/a   Warm Hand Off Completed.       SUBJECTIVE: Jacqueline [name] is a 36 y.o. female accompanied by n/a Patient was referred by Manya Silvas, CNM for insomnia and anxiety. Patient reports the following symptoms/concerns: Pt states her primary concern today is feeling overwhelmed with juggling pregnancy, work, relationship, work/life balance, including panic attack at work; open to learning coping strategies for current life stress. No negative side effects with new BH medication. Duration of problem: Current pregnancy; Severity of problem: moderate  OBJECTIVE: Mood: Anxious and Affect: Appropriate Risk of harm to self or others: No plan to harm self or others  LIFE CONTEXT: Family and Social: Lives with FOB and child School/Work: Works fulltime Self-Care: Has tried some meditation, recognizing greater need for self-care Life Changes: Current pregnancy   GOALS ADDRESSED: Patient will: 1. Reduce symptoms of: anxiety and insomnia 2. Increase knowledge and/or ability of: self-management skills  3. Demonstrate ability to: Increase healthy adjustment to current life circumstances  INTERVENTIONS: Interventions utilized: Mindfulness or Psychologist, educational and Psychoeducation and/or Health Education  Standardized Assessments completed: GAD-7 and PHQ 9  ASSESSMENT: Patient currently experiencing Generalized anxiety disorder and Insomnia.   Patient may benefit from psychoeducation and brief therapeutic intervention regarding coping with symptoms of anxiety and insomnia.  PLAN: 1. Follow up with behavioral health clinician on : One week  phone f/u; as needed for office visit 2. Behavioral recommendations:  -CALM relaxation breathing exercise every morning with prenatal vitamins; as needed throughout the day -Relax Melodies app on phone for sleep -Read educational materials regarding coping with symptoms of anxiety with panic attack 3. Referral(s): [facility] (In Clinic) 4. "From scale of 1-10, how likely are you to follow plan?": Hyder McMannes, LCSWA

## 2016-10-19 ENCOUNTER — Ambulatory Visit (INDEPENDENT_AMBULATORY_CARE_PROVIDER_SITE_OTHER): Payer: Medicaid Other

## 2016-10-19 VITALS — BP 106/68 | HR 76

## 2016-10-19 DIAGNOSIS — O24415 Gestational diabetes mellitus in pregnancy, controlled by oral hypoglycemic drugs: Secondary | ICD-10-CM | POA: Diagnosis not present

## 2016-10-19 NOTE — Progress Notes (Signed)
NST only visit. Kathrene Alu RNBSN

## 2016-10-23 ENCOUNTER — Ambulatory Visit (INDEPENDENT_AMBULATORY_CARE_PROVIDER_SITE_OTHER): Payer: Medicaid Other

## 2016-10-23 VITALS — BP 107/72 | HR 74

## 2016-10-23 DIAGNOSIS — O24415 Gestational diabetes mellitus in pregnancy, controlled by oral hypoglycemic drugs: Secondary | ICD-10-CM

## 2016-10-23 NOTE — Progress Notes (Addendum)
Here for NST/AFI   NST reactive AFI 11.8

## 2016-10-23 NOTE — Progress Notes (Signed)
Patient here for NST and AFI. Kathrene Alu RN

## 2016-10-25 ENCOUNTER — Ambulatory Visit (HOSPITAL_COMMUNITY)
Admission: RE | Admit: 2016-10-25 | Discharge: 2016-10-25 | Disposition: A | Discharge status: Home / Self Care | Payer: Medicaid Other | Source: Ambulatory Visit | Attending: Advanced Practice Midwife | Admitting: Advanced Practice Midwife

## 2016-10-25 ENCOUNTER — Ambulatory Visit (HOSPITAL_COMMUNITY): Admission: RE | Admit: 2016-10-25 | Payer: Medicaid Other | Source: Ambulatory Visit

## 2016-10-25 ENCOUNTER — Encounter (HOSPITAL_COMMUNITY): Payer: Self-pay

## 2016-10-25 DIAGNOSIS — O09523 Supervision of elderly multigravida, third trimester: Secondary | ICD-10-CM | POA: Diagnosis not present

## 2016-10-25 DIAGNOSIS — Z3480 Encounter for supervision of other normal pregnancy, unspecified trimester: Secondary | ICD-10-CM

## 2016-10-25 DIAGNOSIS — O34219 Maternal care for unspecified type scar from previous cesarean delivery: Secondary | ICD-10-CM | POA: Insufficient documentation

## 2016-10-25 DIAGNOSIS — O24415 Gestational diabetes mellitus in pregnancy, controlled by oral hypoglycemic drugs: Secondary | ICD-10-CM | POA: Diagnosis not present

## 2016-10-25 DIAGNOSIS — Z3A33 33 weeks gestation of pregnancy: Secondary | ICD-10-CM | POA: Insufficient documentation

## 2016-10-26 ENCOUNTER — Ambulatory Visit (INDEPENDENT_AMBULATORY_CARE_PROVIDER_SITE_OTHER): Payer: Medicaid Other | Admitting: Obstetrics & Gynecology

## 2016-10-26 VITALS — BP 122/68 | HR 79 | Wt 174.0 lb

## 2016-10-26 DIAGNOSIS — O24415 Gestational diabetes mellitus in pregnancy, controlled by oral hypoglycemic drugs: Secondary | ICD-10-CM

## 2016-10-26 DIAGNOSIS — O09523 Supervision of elderly multigravida, third trimester: Secondary | ICD-10-CM

## 2016-10-26 DIAGNOSIS — Z3480 Encounter for supervision of other normal pregnancy, unspecified trimester: Secondary | ICD-10-CM

## 2016-10-26 NOTE — Progress Notes (Signed)
   PRENATAL VISIT NOTE  Subjective:  Jacqueline [name] is a 36 y.o. [phone] at [redacted]w[redacted]d being seen today for ongoing prenatal care.  She is currently monitored for the following issues for this high-risk pregnancy and has Supervision of other normal pregnancy, antepartum; Advanced maternal age in multigravida; Previous cesarean delivery, antepartum; Seasonal allergies; and Gestational diabetes mellitus (GDM) in third trimester controlled on oral hypoglycemic drug on her problem list.  Patient reports no complaints.  Contractions: Not present. Vag. Bleeding: None.  Movement: Present. Denies leaking of fluid.   The following portions of the patient's history were reviewed and updated as appropriate: allergies, current medications, past family history, past medical history, past social history, past surgical history and problem list. Problem list updated.  Objective:   Vitals:   10/26/16 1046  BP: 122/68  Pulse: 79  Weight: 174 lb (78.9 kg)    Fetal Status: Fetal Heart Rate (bpm): NST   Movement: Present     General:  Alert, oriented and cooperative. Patient is in no acute distress.  Skin: Skin is warm and dry. No rash noted.   Cardiovascular: Normal heart rate noted  Respiratory: Normal respiratory effort, no problems with respiration noted  Abdomen: Soft, gravid, appropriate for gestational age.  Pain/Pressure: Absent     Pelvic: Cervical exam deferred        Extremities: Normal range of motion.  Edema: None  Mental Status:  Normal mood and affect. Normal behavior. Normal judgment and thought content.   Assessment and Plan:  Pregnancy: G4P1021 at [redacted]w[redacted]d  1. Supervision of other normal pregnancy, antepartum  - She had a reassuring MFM u/s today   2. Gestational diabetes mellitus (GDM) in third trimester controlled on oral hypoglycemic drug - She didn't bring her sugars in today but with quizzing, it sounds like she is doing very well with only a few outlying numbers - Continue  twice weekly testing  3. Elderly multigravida in third trimester   Preterm labor symptoms and general obstetric precautions including but not limited to vaginal bleeding, contractions, leaking of fluid and fetal movement were reviewed in detail with the patient. Please refer to After Visit Summary for other counseling recommendations.  No Follow-up on file.   Emily Filbert, MD

## 2016-10-30 ENCOUNTER — Other Ambulatory Visit: Payer: Medicaid Other

## 2016-11-02 ENCOUNTER — Other Ambulatory Visit: Payer: Medicaid Other

## 2016-11-06 ENCOUNTER — Ambulatory Visit (INDEPENDENT_AMBULATORY_CARE_PROVIDER_SITE_OTHER): Payer: Medicaid Other

## 2016-11-06 ENCOUNTER — Telehealth: Payer: Self-pay | Admitting: Obstetrics & Gynecology

## 2016-11-06 VITALS — BP 120/70 | HR 80

## 2016-11-06 DIAGNOSIS — O24415 Gestational diabetes mellitus in pregnancy, controlled by oral hypoglycemic drugs: Secondary | ICD-10-CM

## 2016-11-06 NOTE — Telephone Encounter (Signed)
TC to pt to ask about ads for [facility].  Pt says she 'would love to!'   clh-S

## 2016-11-06 NOTE — Progress Notes (Signed)
NST reactive.

## 2016-11-06 NOTE — Progress Notes (Signed)
Patient presents for NST and AFI. Kathrene Alu RNBSN

## 2016-11-09 ENCOUNTER — Other Ambulatory Visit (HOSPITAL_COMMUNITY)
Admission: RE | Admit: 2016-11-09 | Discharge: 2016-11-09 | Disposition: A | Discharge status: Home / Self Care | Payer: Medicaid Other | Source: Ambulatory Visit | Attending: Obstetrics & Gynecology | Admitting: Obstetrics & Gynecology

## 2016-11-09 ENCOUNTER — Ambulatory Visit (INDEPENDENT_AMBULATORY_CARE_PROVIDER_SITE_OTHER): Payer: Medicaid Other | Admitting: Obstetrics & Gynecology

## 2016-11-09 VITALS — BP 116/68 | HR 82 | Wt 177.0 lb

## 2016-11-09 DIAGNOSIS — B373 Candidiasis of vulva and vagina: Secondary | ICD-10-CM | POA: Diagnosis not present

## 2016-11-09 DIAGNOSIS — Z3480 Encounter for supervision of other normal pregnancy, unspecified trimester: Secondary | ICD-10-CM | POA: Insufficient documentation

## 2016-11-09 DIAGNOSIS — Z3490 Encounter for supervision of normal pregnancy, unspecified, unspecified trimester: Secondary | ICD-10-CM

## 2016-11-09 DIAGNOSIS — O09523 Supervision of elderly multigravida, third trimester: Secondary | ICD-10-CM

## 2016-11-09 DIAGNOSIS — O34219 Maternal care for unspecified type scar from previous cesarean delivery: Secondary | ICD-10-CM

## 2016-11-09 DIAGNOSIS — Z3483 Encounter for supervision of other normal pregnancy, third trimester: Secondary | ICD-10-CM

## 2016-11-09 DIAGNOSIS — J302 Other seasonal allergic rhinitis: Secondary | ICD-10-CM

## 2016-11-09 DIAGNOSIS — Z23 Encounter for immunization: Secondary | ICD-10-CM | POA: Diagnosis not present

## 2016-11-09 DIAGNOSIS — O24415 Gestational diabetes mellitus in pregnancy, controlled by oral hypoglycemic drugs: Secondary | ICD-10-CM

## 2016-11-09 LAB — OB RESULTS CONSOLE GBS: GBS: POSITIVE

## 2016-11-09 LAB — OB RESULTS CONSOLE GC/CHLAMYDIA: GC PROBE AMP, GENITAL: NEGATIVE

## 2016-11-09 NOTE — Progress Notes (Addendum)
   PRENATAL VISIT NOTE  Subjective:  Jacqueline [name] is a 36 y.o. [phone] at [redacted]w[redacted]d being seen today for ongoing prenatal care.  She is currently monitored for the following issues for this high-risk pregnancy and has Supervision of other normal pregnancy, antepartum; Advanced maternal age in multigravida; Previous cesarean delivery, antepartum; Seasonal allergies; and Gestational diabetes mellitus (GDM) in third trimester controlled on oral hypoglycemic drug on her problem list.  Patient reports vaginal irritation.  Contractions: Not present. Vag. Bleeding: None.  Movement: Present. Denies leaking of fluid.   The following portions of the patient's history were reviewed and updated as appropriate: allergies, current medications, past family history, past medical history, past social history, past surgical history and problem list. Problem list updated.  Objective:   Vitals:   11/09/16 1034  BP: 116/68  Pulse: 82  Weight: 177 lb (80.3 kg)    Fetal Status: Fetal Heart Rate (bpm): NST   Movement: Present     General:  Alert, oriented and cooperative. Patient is in no acute distress.  Skin: Skin is warm and dry. No rash noted.   Cardiovascular: Normal heart rate noted  Respiratory: Normal respiratory effort, no problems with respiration noted  Abdomen: Soft, gravid, appropriate for gestational age.  Pain/Pressure: Absent     Pelvic: Cervical exam performed        Extremities: Normal range of motion.  Edema: Trace  Mental Status:  Normal mood and affect. Normal behavior. Normal judgment and thought content.   Assessment and Plan:  Pregnancy: G4P1021 at [redacted]w[redacted]d  1. Prenatal care, antepartum  - GC/Chlamydia probe amp ([facility])not at [facility] - Culture, beta strep (group b only)  2. Supervision of other normal pregnancy, antepartum  - Cervicovaginal ancillary only - Flu Vaccine QUAD 36+ mos IM (Fluarix, Quad PF)  3. Previous cesarean delivery, antepartum H/o LTCS 06/2004 at  [facility] by Dr. Cletis Media  4. Gestational diabetes mellitus (GDM) in third trimester controlled on oral hypoglycemic drug All readings are WNL  NST reviewed and reactive.  5. Elderly multigravida in third trimester  6. Seasonal allergies   Preterm labor symptoms and general obstetric precautions including but not limited to vaginal bleeding, contractions, leaking of fluid and fetal movement were reviewed in detail with the patient. Please refer to After Visit Summary for other counseling recommendations.  Return in about 1 week (around 11/16/2016).   Lavonia Drafts, MD

## 2016-11-09 NOTE — Patient Instructions (Signed)
Group B Streptococcus Infection During Pregnancy Group B Streptococcus (GBS) is a type of bacteria (Streptococcus agalactiae) that is often found in healthy people, commonly in the rectum, vagina, and intestines. In people who are healthy and not pregnant, the bacteria rarely cause serious illness or complications. However, women who test positive for GBS during pregnancy can pass the bacteria to their baby during childbirth, which can cause serious infection in the baby after birth. Women with GBS may also have infections during their pregnancy or immediately after childbirth, such as such as urinary tract infections (UTIs) or infections of the uterus (uterine infections). Having GBS also increases a woman's risk of complications during pregnancy, such as early (preterm) labor or delivery, miscarriage, or stillbirth. Routine testing (screening) for GBS is recommended for all pregnant women. What increases the risk? You may have a higher risk for GBS infection during pregnancy if you had one during a past pregnancy. What are the signs or symptoms? In most cases, GBS infection does not cause symptoms in pregnant women. Signs and symptoms of a possible GBS-related infection may include:  Labor starting before the 37th week of pregnancy.  A UTI or bladder infection, which may cause: ? Fever. ? Pain or burning during urination. ? Frequent urination.  Fever during labor, along with: ? Bad-smelling discharge. ? Uterine tenderness. ? Rapid heartbeat in the mother, baby, or both.  Rare but serious symptoms of a possible GBS-related infection in women include:  Blood infection (septicemia). This may cause fever, chills, or confusion.  Lung infection (pneumonia). This may cause fever, chills, cough, rapid breathing, difficulty breathing, or chest pain.  Bone, joint, skin, or soft tissue infection.  How is this diagnosed? You may be screened for GBS between week 35 and week 37 of your pregnancy. If  you have symptoms of preterm labor, you may be screened earlier. This condition is diagnosed based on lab test results from:  A swab of fluid from the vagina and rectum.  A urine sample.  How is this treated? This condition is treated with antibiotic medicine. When you go into labor, or as soon as your water breaks (your membranes rupture), you will be given antibiotics through an IV tube. Antibiotics will continue until after you give birth. If you are having a cesarean delivery, you do not need antibiotics unless your membranes have already ruptured. Follow these instructions at home:  Take over-the-counter and prescription medicines only as told by your health care provider.  Take your antibiotic medicine as told by your health care provider. Do not stop taking the antibiotic even if you start to feel better.  Keep all pre-birth (prenatal) visits and follow-up visits as told by your health care provider. This is important. Contact a health care provider if:  You have pain or burning when you urinate.  You have to urinate frequently.  You have a fever or chills.  You develop a bad-smelling vaginal discharge. Get help right away if:  Your membranes rupture.  You go into labor.  You have severe pain in your abdomen.  You have difficulty breathing.  You have chest pain. This information is not intended to replace advice given to you by your health care provider. Make sure you discuss any questions you have with your health care provider. Document Released: 04/10/2007 Document Revised: 07/29/2015 Document Reviewed: 07/28/2015 Elsevier Interactive Patient Education  2018 [employer]  

## 2016-11-12 LAB — CULTURE, BETA STREP (GROUP B ONLY): Strep Gp B Culture: POSITIVE — AB

## 2016-11-13 ENCOUNTER — Other Ambulatory Visit: Payer: Medicaid Other

## 2016-11-13 LAB — GC/CHLAMYDIA PROBE AMP (~~LOC~~) NOT AT ARMC
Chlamydia: NEGATIVE
NEISSERIA GONORRHEA: NEGATIVE

## 2016-11-13 LAB — CERVICOVAGINAL ANCILLARY ONLY
Bacterial vaginitis: NEGATIVE
CANDIDA VAGINITIS: POSITIVE — AB

## 2016-11-16 ENCOUNTER — Encounter (HOSPITAL_COMMUNITY): Payer: Self-pay

## 2016-11-16 ENCOUNTER — Inpatient Hospital Stay (EMERGENCY_DEPARTMENT_HOSPITAL)
Admission: AD | Admit: 2016-11-16 | Discharge: 2016-11-16 | Disposition: A | Discharge status: Home / Self Care | Payer: Medicaid Other | Source: Ambulatory Visit | Attending: Obstetrics & Gynecology | Admitting: Obstetrics & Gynecology

## 2016-11-16 ENCOUNTER — Ambulatory Visit (INDEPENDENT_AMBULATORY_CARE_PROVIDER_SITE_OTHER): Payer: Medicaid Other | Admitting: Family Medicine

## 2016-11-16 VITALS — BP 114/70 | HR 82 | Wt 177.0 lb

## 2016-11-16 DIAGNOSIS — O24419 Gestational diabetes mellitus in pregnancy, unspecified control: Secondary | ICD-10-CM

## 2016-11-16 DIAGNOSIS — Z3480 Encounter for supervision of other normal pregnancy, unspecified trimester: Secondary | ICD-10-CM

## 2016-11-16 DIAGNOSIS — O09523 Supervision of elderly multigravida, third trimester: Secondary | ICD-10-CM

## 2016-11-16 DIAGNOSIS — O26893 Other specified pregnancy related conditions, third trimester: Secondary | ICD-10-CM | POA: Diagnosis not present

## 2016-11-16 DIAGNOSIS — N898 Other specified noninflammatory disorders of vagina: Secondary | ICD-10-CM

## 2016-11-16 DIAGNOSIS — O34219 Maternal care for unspecified type scar from previous cesarean delivery: Secondary | ICD-10-CM

## 2016-11-16 DIAGNOSIS — Z3483 Encounter for supervision of other normal pregnancy, third trimester: Secondary | ICD-10-CM

## 2016-11-16 HISTORY — DX: Gestational diabetes mellitus in pregnancy, unspecified control: O24.419

## 2016-11-16 LAB — POCT FERN TEST

## 2016-11-16 MED ORDER — FLUCONAZOLE 150 MG PO TABS: 150.0000 mg | ORAL_TABLET | Freq: Once | ORAL | 0 refills | Status: DC

## 2016-11-16 NOTE — Progress Notes (Signed)
   PRENATAL VISIT NOTE  Subjective:  Jacqueline [name] is a 36 y.o. [phone] at [redacted]w[redacted]d being seen today for ongoing prenatal care.  She is currently monitored for the following issues for this high-risk pregnancy and has Supervision of other normal pregnancy, antepartum; Advanced maternal age in multigravida; Previous cesarean delivery, antepartum; Seasonal allergies; and Gestational diabetes mellitus (GDM) in third trimester controlled on oral hypoglycemic drug on her problem list.  Patient reports no complaints.  Contractions: Not present. Vag. Bleeding: None.  Movement: Present. Denies leaking of fluid.   GDM: Patient taking glyburide 1.25mg  twice a day.  Reports no hypoglycemic episodes.  Tolerating medication well. Did not bring log book Fasting: less than 90 2hr PP: less than 120   The following portions of the patient's history were reviewed and updated as appropriate: allergies, current medications, past family history, past medical history, past social history, past surgical history and problem list. Problem list updated.  Objective:   Vitals:   11/16/16 0922  BP: 114/70  Pulse: 82  Weight: 177 lb (80.3 kg)    Fetal Status: Fetal Heart Rate (bpm): NST   Movement: Present     General:  Alert, oriented and cooperative. Patient is in no acute distress.  Skin: Skin is warm and dry. No rash noted.   Cardiovascular: Normal heart rate noted  Respiratory: Normal respiratory effort, no problems with respiration noted  Abdomen: Soft, gravid, appropriate for gestational age.  Pain/Pressure: Present     Pelvic: Cervical exam deferred        Extremities: Normal range of motion.     Mental Status:  Normal mood and affect. Normal behavior. Normal judgment and thought content.   Assessment and Plan:  Pregnancy: G4P1021 at [redacted]w[redacted]d  1. Supervision of other normal pregnancy, antepartum FHT and FH  2. Gestational diabetes mellitus (GDM) in third trimester, gestational diabetes method  of control unspecified Well controlled. NST today. Schedule growth [place] in 1 week Discussed induction at 39 weeks. Patient   3. Elderly multigravida in third trimester  4. Previous cesarean delivery, antepartum Desires TOLAC.   Preterm labor symptoms and general obstetric precautions including but not limited to vaginal bleeding, contractions, leaking of fluid and fetal movement were reviewed in detail with the patient. Please refer to After Visit Summary for other counseling recommendations.  No Follow-up on file.   Truett Mainland, DO

## 2016-11-16 NOTE — Discharge Instructions (Signed)

## 2016-11-16 NOTE — MAU Note (Signed)
Ctrx 10 mins apart since 1900. Also reports leaking of fluid. Denies bleeding. +FM.

## 2016-11-16 NOTE — MAU Provider Note (Signed)
History     CSN: [id number]  Arrival date and time: 11/16/16 1956   None     Chief Complaint  Patient presents with  . Contractions  . Rupture of Membranes   HPI Jacqueline [name] is a 36yo [id number] @ 36.5wks who presents for eval of possibly leaking fluid. While working/walking around today she felt some increased vag d/c. Did not visualize fluid. Denies bldg, pain, or ctx. Reports +FM. Her preg has been followed by the [facility] office and has been remarkable for 1) A2GDM 2) prev C/S- desires TOLAC  OB History    Gravida Para Term Preterm AB Living   4 1 1   2 1    SAB TAB Ectopic Multiple Live Births   2       1      Past Medical History:  Diagnosis Date  . Fibroid   . Gestational diabetes     Past Surgical History:  Procedure Laterality Date  . CESAREAN SECTION      Family History  Problem Relation Age of Onset  . Hyperlipidemia Mother   . Stroke Brother   . Hypertension Maternal Grandmother     Social History  Substance Use Topics  . Smoking status: Never Smoker  . Smokeless tobacco: Never Used  . Alcohol use No    Allergies:  Allergies  Allergen Reactions  . Penicillins Hives, Shortness Of Breath and Other (See Comments)    Has patient had a PCN reaction causing immediate rash, facial/tongue/throat swelling, SOB or lightheadedness with hypotension: Yes Has patient had a PCN reaction causing severe rash involving mucus membranes or skin necrosis: No Has patient had a PCN reaction that required hospitalization No Has patient had a PCN reaction occurring within the last 10 years: Yes If all of the above answers are "NO", then may proceed with Cephalosporin use.    Prescriptions Prior to Admission  Medication Sig Dispense Refill Last Dose  . cetirizine (ZYRTEC) 10 MG tablet Take 10 mg by mouth daily.   11/16/2016 at Unknown time  . fluticasone (FLONASE) 50 MCG/ACT nasal spray Place 1 spray into both nostrils daily.    11/16/2016 at Unknown time  . glyBURIDE  (DIABETA) 1.25 MG tablet Take 1 tablet (1.25 mg total) by mouth 2 (two) times daily with a meal. 180 tablet 0 11/16/2016 at Unknown time  . Prenatal Vit-Fe Fumarate-FA (MULTIVITAMIN-PRENATAL) 27-0.8 MG TABS tablet Take 1 tablet by mouth daily at 12 noon.   11/16/2016 at Unknown time  . sertraline (ZOLOFT) 50 MG tablet Take 1 tablet (50 mg total) by mouth daily. 30 tablet 3 11/15/2016 at Unknown time  . fluconazole (DIFLUCAN) 150 MG tablet Take 1 tablet (150 mg total) by mouth once. (Patient not taking: Reported on 11/16/2016) 1 tablet 0 Not Taking at Unknown time  . metroNIDAZOLE (FLAGYL) 500 MG tablet Take 1 tablet (500 mg total) by mouth 2 (two) times daily. (Patient not taking: Reported on 10/26/2016) 14 tablet 0 Not Taking    Review of Systems No other pertinents other than what is listed in HPI  Physical Exam   Blood pressure 121/77, pulse 88, temperature (!) 97.5 F (36.4 C), temperature source Oral, resp. rate 18, last menstrual period 03/04/2016.  Physical Exam  Constitutional: She is oriented to person, place, and time. She appears well-developed.  HENT:  Head: Normocephalic.  Neck: Normal range of motion.  Cardiovascular: Normal rate.   Respiratory: Effort normal.  GI:  EFM 135-140s, +accels, no decels Irreg, mild UI  Genitourinary: Vagina normal.  Genitourinary Comments: SSE: mod thin white d/c, no odor Cx C/L  Musculoskeletal: Normal range of motion.  Neurological: She is alert and oriented to person, place, and time.  Skin: Skin is warm and dry.  Psychiatric: She has a normal mood and affect. Her behavior is normal. Thought content normal.    MAU Course  Procedures  MDM NST, SSE  Assessment and Plan  IUP@36 .5wks A2GDM Vaginal d/c  D/C home with preterm/ROM/bldg precautions F/U at next scheduled visits next week, or sooner prn  Serita Grammes CNM 11/16/2016, 9:31 PM

## 2016-11-17 ENCOUNTER — Inpatient Hospital Stay (HOSPITAL_COMMUNITY): Payer: Medicaid Other | Admitting: Anesthesiology

## 2016-11-17 ENCOUNTER — Encounter (HOSPITAL_COMMUNITY): Payer: Self-pay

## 2016-11-17 ENCOUNTER — Inpatient Hospital Stay (HOSPITAL_COMMUNITY)
Admission: AD | Admit: 2016-11-17 | Discharge: 2016-11-20 | DRG: 807 | Disposition: A | Discharge status: Home / Self Care | Payer: Medicaid Other | Source: Ambulatory Visit | Attending: Family Medicine | Admitting: Family Medicine

## 2016-11-17 DIAGNOSIS — Z880 Allergy status to penicillin: Secondary | ICD-10-CM

## 2016-11-17 DIAGNOSIS — O99824 Streptococcus B carrier state complicating childbirth: Secondary | ICD-10-CM | POA: Diagnosis present

## 2016-11-17 DIAGNOSIS — O42919 Preterm premature rupture of membranes, unspecified as to length of time between rupture and onset of labor, unspecified trimester: Secondary | ICD-10-CM | POA: Diagnosis present

## 2016-11-17 DIAGNOSIS — Z3A36 36 weeks gestation of pregnancy: Secondary | ICD-10-CM

## 2016-11-17 DIAGNOSIS — O34219 Maternal care for unspecified type scar from previous cesarean delivery: Secondary | ICD-10-CM | POA: Diagnosis present

## 2016-11-17 DIAGNOSIS — N898 Other specified noninflammatory disorders of vagina: Secondary | ICD-10-CM | POA: Diagnosis not present

## 2016-11-17 DIAGNOSIS — O34211 Maternal care for low transverse scar from previous cesarean delivery: Secondary | ICD-10-CM | POA: Diagnosis present

## 2016-11-17 DIAGNOSIS — O24425 Gestational diabetes mellitus in childbirth, controlled by oral hypoglycemic drugs: Secondary | ICD-10-CM | POA: Diagnosis present

## 2016-11-17 DIAGNOSIS — O26893 Other specified pregnancy related conditions, third trimester: Secondary | ICD-10-CM | POA: Diagnosis not present

## 2016-11-17 DIAGNOSIS — O24415 Gestational diabetes mellitus in pregnancy, controlled by oral hypoglycemic drugs: Secondary | ICD-10-CM

## 2016-11-17 DIAGNOSIS — O42013 Preterm premature rupture of membranes, onset of labor within 24 hours of rupture, third trimester: Secondary | ICD-10-CM | POA: Diagnosis not present

## 2016-11-17 DIAGNOSIS — O42913 Preterm premature rupture of membranes, unspecified as to length of time between rupture and onset of labor, third trimester: Secondary | ICD-10-CM | POA: Diagnosis present

## 2016-11-17 LAB — CBC
HEMATOCRIT: 34.5 % — AB (ref 36.0–46.0)
Hemoglobin: 12.1 g/dL (ref 12.0–15.0)
MCH: 31 pg (ref 26.0–34.0)
MCHC: 35.1 g/dL (ref 30.0–36.0)
MCV: 88.5 fL (ref 78.0–100.0)
PLATELETS: 143 10*3/uL — AB (ref 150–400)
RBC: 3.9 MIL/uL (ref 3.87–5.11)
RDW: 13.7 % (ref 11.5–15.5)
WBC: 8.6 10*3/uL (ref 4.0–10.5)

## 2016-11-17 LAB — TYPE AND SCREEN
ABO/RH(D): A POS
Antibody Screen: NEGATIVE

## 2016-11-17 LAB — GLUCOSE, CAPILLARY
Glucose-Capillary: 92 mg/dL (ref 65–99)
Glucose-Capillary: 87 mg/dL (ref 65–99)

## 2016-11-17 LAB — POCT FERN TEST: POCT Fern Test: POSITIVE

## 2016-11-17 LAB — ABO/RH: ABO/RH(D): A POS

## 2016-11-17 MED ORDER — SERTRALINE HCL 50 MG PO TABS
50.0000 mg | ORAL_TABLET | Freq: Every day | ORAL | Status: DC
  Administered 2016-11-17 – 2016-11-19 (×3): 50 mg via ORAL
  Filled 2016-11-17 (×4): qty 1

## 2016-11-17 MED ORDER — OXYCODONE-ACETAMINOPHEN 5-325 MG PO TABS: 1.0000 | ORAL_TABLET | ORAL | Status: DC | PRN

## 2016-11-17 MED ORDER — DIPHENHYDRAMINE HCL 25 MG PO CAPS
50.0000 mg | ORAL_CAPSULE | Freq: Four times a day (QID) | ORAL | Status: DC | PRN
  Administered 2016-11-17: 50 mg via ORAL
  Filled 2016-11-17: qty 2

## 2016-11-17 MED ORDER — LIDOCAINE HCL (PF) 1 % IJ SOLN
INTRAMUSCULAR | Status: DC | PRN
  Administered 2016-11-17: 6 mL via EPIDURAL
  Administered 2016-11-17: 4 mL

## 2016-11-17 MED ORDER — OXYTOCIN 40 UNITS IN LACTATED RINGERS INFUSION - SIMPLE MED
1.0000 m[IU]/min | INTRAVENOUS | Status: DC
  Administered 2016-11-17: 1 m[IU]/min via INTRAVENOUS
  Filled 2016-11-17: qty 1000

## 2016-11-17 MED ORDER — SOD CITRATE-CITRIC ACID 500-334 MG/5ML PO SOLN: 30.0000 mL | ORAL | Status: DC | PRN

## 2016-11-17 MED ORDER — OXYTOCIN 40 UNITS IN LACTATED RINGERS INFUSION - SIMPLE MED
2.5000 [IU]/h | INTRAVENOUS | Status: DC
  Administered 2016-11-18: 2.5 [IU]/h via INTRAVENOUS

## 2016-11-17 MED ORDER — FLEET ENEMA 7-19 GM/118ML RE ENEM: 1.0000 | ENEMA | RECTAL | Status: DC | PRN

## 2016-11-17 MED ORDER — LACTATED RINGERS IV SOLN
500.0000 mL | INTRAVENOUS | Status: DC | PRN
  Administered 2016-11-18: 1000 mL via INTRAVENOUS

## 2016-11-17 MED ORDER — OXYCODONE-ACETAMINOPHEN 5-325 MG PO TABS: 2.0000 | ORAL_TABLET | ORAL | Status: DC | PRN

## 2016-11-17 MED ORDER — ONDANSETRON HCL 4 MG/2ML IJ SOLN
4.0000 mg | Freq: Four times a day (QID) | INTRAMUSCULAR | Status: DC | PRN
  Administered 2016-11-18: 4 mg via INTRAVENOUS
  Filled 2016-11-17 (×2): qty 2

## 2016-11-17 MED ORDER — MISOPROSTOL 25 MCG QUARTER TABLET: 25.0000 ug | ORAL_TABLET | ORAL | Status: DC | PRN

## 2016-11-17 MED ORDER — OXYTOCIN BOLUS FROM INFUSION
500.0000 mL | Freq: Once | INTRAVENOUS | Status: AC
  Administered 2016-11-18: 500 mL via INTRAVENOUS

## 2016-11-17 MED ORDER — ACETAMINOPHEN 325 MG PO TABS
650.0000 mg | ORAL_TABLET | ORAL | Status: DC | PRN
  Administered 2016-11-18: 650 mg via ORAL
  Filled 2016-11-17: qty 2

## 2016-11-17 MED ORDER — PHENYLEPHRINE 40 MCG/ML (10ML) SYRINGE FOR IV PUSH (FOR BLOOD PRESSURE SUPPORT)
80.0000 ug | PREFILLED_SYRINGE | INTRAVENOUS | Status: DC | PRN
  Filled 2016-11-17: qty 5
  Filled 2016-11-17: qty 10

## 2016-11-17 MED ORDER — TERBUTALINE SULFATE 1 MG/ML IJ SOLN
0.2500 mg | Freq: Once | INTRAMUSCULAR | Status: DC | PRN
  Filled 2016-11-17: qty 1

## 2016-11-17 MED ORDER — LORATADINE 10 MG PO TABS: 10.0000 mg | ORAL_TABLET | Freq: Every day | ORAL | Status: DC

## 2016-11-17 MED ORDER — LACTATED RINGERS IV SOLN
INTRAVENOUS | Status: DC
  Administered 2016-11-17 – 2016-11-18 (×3): via INTRAVENOUS

## 2016-11-17 MED ORDER — VANCOMYCIN HCL IN DEXTROSE 1-5 GM/200ML-% IV SOLN
1000.0000 mg | Freq: Two times a day (BID) | INTRAVENOUS | Status: DC
  Administered 2016-11-17 – 2016-11-18 (×2): 1000 mg via INTRAVENOUS
  Filled 2016-11-17 (×3): qty 200

## 2016-11-17 MED ORDER — DIPHENHYDRAMINE HCL 50 MG/ML IJ SOLN: 12.5000 mg | INTRAMUSCULAR | Status: DC | PRN

## 2016-11-17 MED ORDER — PHENYLEPHRINE 40 MCG/ML (10ML) SYRINGE FOR IV PUSH (FOR BLOOD PRESSURE SUPPORT)
80.0000 ug | PREFILLED_SYRINGE | INTRAVENOUS | Status: DC | PRN
  Filled 2016-11-17: qty 5

## 2016-11-17 MED ORDER — LIDOCAINE HCL (PF) 1 % IJ SOLN
30.0000 mL | INTRAMUSCULAR | Status: DC | PRN
  Filled 2016-11-17: qty 30

## 2016-11-17 MED ORDER — FENTANYL 2.5 MCG/ML BUPIVACAINE 1/10 % EPIDURAL INFUSION (WH - ANES)
14.0000 mL/h | INTRAMUSCULAR | Status: DC | PRN
  Administered 2016-11-17 – 2016-11-18 (×3): 14 mL/h via EPIDURAL
  Filled 2016-11-17 (×3): qty 100

## 2016-11-17 MED ORDER — FENTANYL CITRATE (PF) 100 MCG/2ML IJ SOLN
100.0000 ug | INTRAMUSCULAR | Status: DC | PRN
  Administered 2016-11-17: 100 ug via INTRAVENOUS
  Filled 2016-11-17 (×2): qty 2

## 2016-11-17 MED ORDER — LACTATED RINGERS IV SOLN
500.0000 mL | Freq: Once | INTRAVENOUS | Status: AC
  Administered 2016-11-17: 500 mL via INTRAVENOUS

## 2016-11-17 MED ORDER — EPHEDRINE 5 MG/ML INJ
10.0000 mg | INTRAVENOUS | Status: DC | PRN
  Filled 2016-11-17: qty 2

## 2016-11-17 NOTE — MAU Note (Signed)
Urine sent to lab 

## 2016-11-17 NOTE — Anesthesia Preprocedure Evaluation (Signed)
Anesthesia Evaluation  Patient identified by MRN, date of birth, ID band Patient awake    Reviewed: Allergy & Precautions, H&P , Patient's Chart, lab work & pertinent test results, reviewed documented beta blocker date and time   Airway Mallampati: II  TM Distance: >3 FB Neck ROM: full    Dental no notable dental hx.    Pulmonary    Pulmonary exam normal breath sounds clear to auscultation       Cardiovascular  Rhythm:regular Rate:Normal     Neuro/Psych    GI/Hepatic   Endo/Other  diabetes  Renal/GU      Musculoskeletal   Abdominal   Peds  Hematology   Anesthesia Other Findings   Reproductive/Obstetrics                             Anesthesia Physical Anesthesia Plan  ASA: II  Anesthesia Plan: Epidural   Post-op Pain Management:    Induction:   PONV Risk Score and Plan:   Airway Management Planned:   Additional Equipment:   Intra-op Plan:   Post-operative Plan:   Informed Consent: I have reviewed the patients History and Physical, chart, labs and discussed the procedure including the risks, benefits and alternatives for the proposed anesthesia with the patient or authorized representative who has indicated his/her understanding and acceptance.   Dental Advisory Given  Plan Discussed with: CRNA and Surgeon  Anesthesia Plan Comments: (Labs checked- platelets confirmed with RN in room. Fetal heart tracing, per RN, reported to be stable enough for sitting procedure. Discussed epidural, and patient consents to the procedure:  included risk of possible headache,backache, failed block, allergic reaction, and nerve injury. This patient was asked if she had any questions or concerns before the procedure started.)        Anesthesia Quick Evaluation

## 2016-11-17 NOTE — Anesthesia Procedure Notes (Signed)
Epidural Patient location during procedure: OB  Staffing Anesthesiologist: Lyndle Herrlich  Preanesthetic Checklist Completed: patient identified, pre-op evaluation, timeout performed, IV checked, risks and benefits discussed and monitors and equipment checked  Epidural Patient position: sitting Prep: DuraPrep Patient monitoring: blood pressure and continuous pulse ox Approach: right paramedian Location: L3-L4 Injection technique: LOR air  Needle:  Needle type: Tuohy  Needle gauge: 17 G Needle insertion depth: 6 cm Catheter type: closed end flexible Catheter size: 19 Gauge Catheter at skin depth: 12 cm Test dose: negative  Assessment Sensory level: T8  Additional Notes  Dosing of Epidural:  1st dose, through catheter ............................................Marland Kitchen  Xylocaine 40 mg  2nd dose, through catheter, after waiting 3 minutes........Marland KitchenXylocaine 60 mg    As each dose occurred, patient was free of IV sx; and patient exhibited no evidence of SA injection.  Patient is more comfortable after epidural dosed. Please see RN's note for documentation of vital signs,and FHR which are stable.  Patient reminded not to try to ambulate with numb legs, and that an RN must be present when she attempts to get up.

## 2016-11-17 NOTE — Progress Notes (Signed)
Labor Progress Note  Jacqueline [name] is a 36 y.o. [id number] at [redacted]w[redacted]d  admitted for PPROM.  S: patient uncomfortable with contractions. IV pain medications not helping. Wanting epidural.   Of note, patient states that she made it to 4cm with her last baby and then had c/s.  O:  BP 135/86   Pulse 88   Temp 98.2 F (36.8 C) (Oral)   Resp 16   Ht 5\' 4"  (1.626 m)   Wt 80.3 kg (177 lb)   LMP 03/04/2016 (Exact Date)   BMI 30.38 kg/m   No intake/output data recorded.  FHT:  FHR: 140 bpm, variability: moderate,  accelerations:  Present,  decelerations:  Absent UC:   regular, every 2-3 minutes SVE:   FB in place defer exam  Pitocin @ 4 mu/min  Labs: Lab Results  Component Value Date   WBC 8.6 11/17/2016   HGB 12.1 11/17/2016   HCT 34.5 (L) 11/17/2016   MCV 88.5 11/17/2016   PLT 143 (L) 11/17/2016    Assessment / Plan: 36 y.o. [id number] [redacted]w[redacted]d not in labor. TOLAC. PPROM  Labor: Progressing normally, FB in place Fetal Wellbeing:  Category I Pain Control:  Epidural Anticipated MOD:  NSVD  Expectant management   Luiz Blare, DO OB Fellow

## 2016-11-17 NOTE — H&P (Signed)
OBSTETRIC ADMISSION HISTORY AND PHYSICAL  Jacqueline [name] is a 36 y.o. female [phone] with IUP at [redacted]w[redacted]d by LMP (03/04/16) presenting for suspected PPROM. She states that last night at 2000 she felt LOF. She was sent home from MAU after fern tested neg. Today, she experienced continuous LOF that changed from clear to pinkish fluid. Denies vaginal bleeding. She called her PCP and was told to come to hospital for assessment and tea. Fern neg and was sent home. She returns today because she states she felt continuous fluid leakage. She reports +FMs, no blurry vision, headaches or peripheral edema, and RUQ pain.  She plans on breast feeding.  She received her prenatal care at [facility]   Dating: By LMP (03/04/16) --->  Estimated Date of Delivery: 12/09/16  Sono:  10/25/16  @[redacted]w[redacted]d , CWD, normal anatomy, cephalic presentation, 5176H, 66% EFW   Prenatal History/Complications: -High risk pregnancy -Advanced maternal age in multigravida -Previous cesarean delivery -Gestational diabetes mellitusA2  Past Medical History: Past Medical History:  Diagnosis Date  . Fibroid   . Gestational diabetes     Past Surgical History: Past Surgical History:  Procedure Laterality Date  . CESAREAN SECTION      Obstetrical History: OB History    Gravida Para Term Preterm AB Living   4 1 1   2 1    SAB TAB Ectopic Multiple Live Births   2       1      Social History: Social History   Social History  . Marital status: Single    Spouse name: N/A  . Number of children: N/A  . Years of education: N/A   Social History Main Topics  . Smoking status: Never Smoker  . Smokeless tobacco: Never Used  . Alcohol use No  . Drug use: No  . Sexual activity: Yes    Birth control/ protection: None   Other Topics Concern  . None   Social History Narrative  . None    Family History: Family History  Problem Relation Age of Onset  . Hyperlipidemia Mother   . Stroke Brother   . Hypertension Maternal  Grandmother     Allergies: Allergies  Allergen Reactions  . Penicillins Hives, Shortness Of Breath and Other (See Comments)    Has patient had a PCN reaction causing immediate rash, facial/tongue/throat swelling, SOB or lightheadedness with hypotension: Yes Has patient had a PCN reaction causing severe rash involving mucus membranes or skin necrosis: No Has patient had a PCN reaction that required hospitalization No Has patient had a PCN reaction occurring within the last 10 years: Yes If all of the above answers are "NO", then may proceed with Cephalosporin use.    Prescriptions Prior to Admission  Medication Sig Dispense Refill Last Dose  . cetirizine (ZYRTEC) 10 MG tablet Take 10 mg by mouth daily.   11/17/2016 at Unknown time  . fluticasone (FLONASE) 50 MCG/ACT nasal spray Place 1 spray into both nostrils daily.    11/17/2016 at Unknown time  . glyBURIDE (DIABETA) 1.25 MG tablet Take 1 tablet (1.25 mg total) by mouth 2 (two) times daily with a meal. (Patient taking differently: Take 2.5 mg by mouth 2 (two) times daily with a meal. ) 180 tablet 0 11/17/2016 at Unknown time  . Prenatal Vit-Fe Fumarate-FA (MULTIVITAMIN-PRENATAL) 27-0.8 MG TABS tablet Take 1 tablet by mouth daily at 12 noon.   11/17/2016 at Unknown time  . sertraline (ZOLOFT) 50 MG tablet Take 1 tablet (50 mg total) by mouth  daily. 30 tablet 3 11/16/2016 at Unknown time     Review of Systems   All systems reviewed and negative except as stated in HPI  Blood pressure 128/75, pulse 81, temperature 98.2 F (36.8 C), temperature source Oral, resp. rate 18, last menstrual period 03/04/2016. General appearance: alert, cooperative and appears stated age Lungs: clear to auscultation bilaterally Heart: regular rate and rhythm Abdomen: soft, non-tender; bowel sounds normal Extremities: Homans sign is negative, no sign of DVT Presentation: cephalic Fetal monitoringBaseline: 140 bpm, Variability: Good {> 6 bpm), Accelerations:  Reactive and Decelerations: Absent Uterine activityNone Dilation: Fingertip Effacement (%): Thick Station: +3 Exam by::  (Dr. Lambert Keto)   Prenatal labs: ABO, Rh: A/Positive/-- (05/16 1632) Antibody: Negative (05/16 1632) Rubella: 1.60 (05/16 1632) RPR: Non Reactive (09/05 0000)  HBsAg: Negative (05/16 1632)  HIV:   Non Reactive GBS:   pos 1 hr Glucola fasting 90/ 1hr 154 / 2hr 157 Genetic screening  Quad neg Anatomy US  normal  Prenatal Transfer Tool  Maternal Diabetes: Yes:  Diabetes Type:  Insulin/Medication controlled, Diet controlled Genetic Screening: Normal Maternal Ultrasounds/Referrals: Normal Fetal Ultrasounds or other Referrals:  None Maternal Substance Abuse:  No Significant Maternal Medications:  Meds include: Other:  glyperide Significant Maternal Lab Results: Lab values include: Group B Strep positive  Results for orders placed or performed during the hospital encounter of 11/17/16 (from the past 24 hour(s))  POCT fern test   Collection Time: 11/17/16  4:03 PM  Result Value Ref Range   POCT Fern Test Positive = ruptured amniotic membanes   Results for orders placed or performed during the hospital encounter of 11/16/16 (from the past 24 hour(s))  Fern Test   Collection Time: 11/16/16  8:47 PM  Result Value Ref Range   POCT Fern Test      Patient Active Problem List   Diagnosis Date Noted  . Preterm premature rupture of membranes 11/17/2016  . Gestational diabetes mellitus (GDM) in third trimester controlled on oral hypoglycemic drug 10/16/2016  . Supervision of other normal pregnancy, antepartum 05/30/2016  . Advanced maternal age in multigravida 05/30/2016  . Previous cesarean delivery, antepartum 05/30/2016  . Seasonal allergies 05/30/2016    Assessment/Plan:  Jacqueline [name] is a 36 y.o. [id number] at [redacted]w[redacted]d here for IOL due to suspected PPROM @2000  on 11/16/2016.  #Labor:Admit to [facility] suites, foley bulb, pitocin, #Pain: per patient  request #FWB: Cat 1 #ID:  GBS positive ( vancomycin 1g IV q12hr) #MOF: breast #MOC: abstinence #Circ:  Boy (outpatient)  Jacqueline Kin, MD  11/17/2016, 5:06 PM  OB FELLOW HISTORY AND PHYSICAL ATTESTATION I have seen and examined this patient; I agree with above documentation in the resident's note.   --PPROM. LOF started at 8pm yesterday but had negative ferning and pooling in MAU yesterday. Continuous LOF today, Now fern positive and +pooling in my cervical exam --Consented for TOLAC --FB placed, start Pit   Gailen Shelter, MD OB Fellow 11/17/2016, 6:13 PM

## 2016-11-17 NOTE — MAU Note (Signed)
Was here last night for leaking and was sent home. Still leaking today and and has noticed light pink streaks when she wipes. Feeling some irregular contractions. +fetal movement

## 2016-11-18 ENCOUNTER — Encounter (HOSPITAL_COMMUNITY): Payer: Self-pay

## 2016-11-18 DIAGNOSIS — Z3A36 36 weeks gestation of pregnancy: Secondary | ICD-10-CM

## 2016-11-18 DIAGNOSIS — O99824 Streptococcus B carrier state complicating childbirth: Secondary | ICD-10-CM

## 2016-11-18 DIAGNOSIS — O42013 Preterm premature rupture of membranes, onset of labor within 24 hours of rupture, third trimester: Secondary | ICD-10-CM

## 2016-11-18 LAB — GLUCOSE, CAPILLARY
GLUCOSE-CAPILLARY: 114 mg/dL — AB (ref 65–99)
GLUCOSE-CAPILLARY: 142 mg/dL — AB (ref 65–99)
GLUCOSE-CAPILLARY: 83 mg/dL (ref 65–99)
GLUCOSE-CAPILLARY: 82 mg/dL (ref 65–99)
Glucose-Capillary: 81 mg/dL (ref 65–99)

## 2016-11-18 LAB — RPR: RPR: NONREACTIVE

## 2016-11-18 MED ORDER — ACETAMINOPHEN 325 MG PO TABS
650.0000 mg | ORAL_TABLET | ORAL | Status: DC | PRN
  Administered 2016-11-19 – 2016-11-20 (×2): 650 mg via ORAL
  Filled 2016-11-18 (×2): qty 2

## 2016-11-18 MED ORDER — TETANUS-DIPHTH-ACELL PERTUSSIS 5-2.5-18.5 LF-MCG/0.5 IM SUSP: 0.5000 mL | Freq: Once | INTRAMUSCULAR | Status: DC

## 2016-11-18 MED ORDER — DIPHENHYDRAMINE HCL 25 MG PO CAPS: 25.0000 mg | ORAL_CAPSULE | Freq: Four times a day (QID) | ORAL | Status: DC | PRN

## 2016-11-18 MED ORDER — IBUPROFEN 600 MG PO TABS
600.0000 mg | ORAL_TABLET | Freq: Four times a day (QID) | ORAL | Status: DC
  Administered 2016-11-18 – 2016-11-20 (×8): 600 mg via ORAL
  Filled 2016-11-18 (×8): qty 1

## 2016-11-18 MED ORDER — ZOLPIDEM TARTRATE 5 MG PO TABS: 5.0000 mg | ORAL_TABLET | Freq: Every evening | ORAL | Status: DC | PRN

## 2016-11-18 MED ORDER — BENZOCAINE-MENTHOL 20-0.5 % EX AERO
1.0000 "application " | INHALATION_SPRAY | CUTANEOUS | Status: DC | PRN
  Administered 2016-11-18: 1 via TOPICAL
  Filled 2016-11-18: qty 56

## 2016-11-18 MED ORDER — WITCH HAZEL-GLYCERIN EX PADS
1.0000 "application " | MEDICATED_PAD | CUTANEOUS | Status: DC | PRN
  Administered 2016-11-18: 1 via TOPICAL

## 2016-11-18 MED ORDER — SODIUM CHLORIDE 0.9% FLUSH: 3.0000 mL | INTRAVENOUS | Status: DC | PRN

## 2016-11-18 MED ORDER — ONDANSETRON HCL 4 MG PO TABS
4.0000 mg | ORAL_TABLET | ORAL | Status: DC | PRN
Start: 2016-11-18 — End: 2016-11-20

## 2016-11-18 MED ORDER — SENNOSIDES-DOCUSATE SODIUM 8.6-50 MG PO TABS
2.0000 | ORAL_TABLET | ORAL | Status: DC
  Administered 2016-11-19 (×2): 2 via ORAL
  Filled 2016-11-18 (×2): qty 2

## 2016-11-18 MED ORDER — PRENATAL MULTIVITAMIN CH
1.0000 | ORAL_TABLET | Freq: Every day | ORAL | Status: DC
  Administered 2016-11-19 – 2016-11-20 (×2): 1 via ORAL
  Filled 2016-11-18 (×2): qty 1

## 2016-11-18 MED ORDER — ONDANSETRON HCL 4 MG/2ML IJ SOLN: 4.0000 mg | INTRAMUSCULAR | Status: DC | PRN

## 2016-11-18 MED ORDER — SODIUM CHLORIDE 0.9 % IV SOLN: 250.0000 mL | INTRAVENOUS | Status: DC | PRN

## 2016-11-18 MED ORDER — DIBUCAINE 1 % RE OINT
1.0000 "application " | TOPICAL_OINTMENT | RECTAL | Status: DC | PRN
  Administered 2016-11-18: 1 via RECTAL
  Filled 2016-11-18: qty 28

## 2016-11-18 MED ORDER — SODIUM CHLORIDE 0.9% FLUSH: 3.0000 mL | Freq: Two times a day (BID) | INTRAVENOUS | Status: DC

## 2016-11-18 MED ORDER — MEASLES, MUMPS & RUBELLA VAC ~~LOC~~ INJ
0.5000 mL | INJECTION | Freq: Once | SUBCUTANEOUS | Status: DC
  Filled 2016-11-18: qty 0.5

## 2016-11-18 MED ORDER — SIMETHICONE 80 MG PO CHEW: 80.0000 mg | CHEWABLE_TABLET | ORAL | Status: DC | PRN

## 2016-11-18 MED ORDER — COCONUT OIL OIL
1.0000 "application " | TOPICAL_OIL | Status: DC | PRN
  Administered 2016-11-18: 1 via TOPICAL
  Filled 2016-11-18: qty 120

## 2016-11-18 NOTE — Progress Notes (Signed)
Labor Progress Note Jacqueline [name] is a 36 y.o. [phone] at [redacted]w[redacted]d admitted for IOL for high-risk pregnancy due to PPROM.  S: Pt resting comfortably in bed. States she feels tired. Denies any headaches, blurry vision or vaginal bleeding.  O:  BP 117/60   Pulse 78   Temp 98.5 F (36.9 C) (Oral)   Resp 16   Ht 5\' 4"  (1.626 m)   Wt 80.3 kg (177 lb)   LMP 03/04/2016 (Exact Date)   SpO2 99%   BMI 30.38 kg/m  EFM: 140 baseline/mod variability/ + accels, no decels  CVE: Dilation: 6 Effacement (%): 80 Cervical Position: Middle Station: -2 Presentation: Vertex Exam by:: Darliss Ridgel RN    A&P: 36 y.o. [id number] [redacted]w[redacted]d admitted for PPROM. TOLAC. Pt doing well and progressing well. Plan for NSVD. In active labor. Will monitor labor course.  #Labor: Progressing well. Foley bulb out. Regular ctx, every 2-4 min #Pain: Epidural #FWB: Cat 1 #GBS positive - VANC PPROM  Hx of GDM  Lacinda Axon, Medical Student 4:07 AM

## 2016-11-18 NOTE — Lactation Note (Signed)
This note was copied from a baby's chart. Lactation Consultation Note  Patient Name: Jacqueline [name] [id number] Date: 11/18/2016 Reason for consult: Initial assessment;Early term 37-38.6wks;Difficult latch  Initial consult at 51 hrs old; Mom is P2 (oldest is 35 yrs old- BF her for 1.5 months).  GDM Glyburide; Hx Anxiety-Zoloft.  PPROM VBAC shoulder dystocia; GBS+ GA 37.0; BW 6 lbs, 9.3 oz.  BBO2 given at birth; Apgars 7 & 8.  Infant has been grunting.  SPO2 at 1714 98% on RA. RN discussed concerns of infant not feeding well with LC prior to LC's visit with mom.   Infant has had only breastfeeding attempts x3 (5 min) + EBM x1 (.5 ml); voids-1; stools-1 since birth; LS-6 by RN.   Infant was "humming" grunting in crib when [facility] entered room.   Discussed LPTI Green Sheet feeding plan with mom and suggested we follow plan since infant is showing many LPTI behaviors (falling asleep at breast; no sustained latch for a 10+ minute duration), and other medical risk factors. Reviewed Green Sheet LPTI plan. Worked with mom on hand expression for 10+ minutes; collected 1 ml colostrum mom spoon fed to infant.  Discussed with mom HE between feeds for next feeding. Infant lapped up colostrum and showing feeding cues.   Extra colostrum collection containers, spoons, and curved-tip syringe given to mom.     Taught cross-cradle hold with breast support and asymmetrical latching technique.  Infant latched and sucked for 2 minutes and then fell asleep.  LS-7. Mom's supper at bedside; mom wanted to eat so LC swaddled infant and put back in crib.  Infant began cueing again.   Mom suggested giving formula amounts since infant cueing.   Discussed finger feeding formula to get infant in sustained sucking pattern or spoon feeding the first 1-2 formulas.  Also discussed 5-French at breast or SNS later to keep infant at breast suckling.   Educated on also feeding with cues in addition to feeding at least every 2.5-3 hrs,  size of infant's stomach, Green sheet DOL supplementation guidelines, limiting feeding total time to <30 minutes, etc... Lactation brochure given and informed of hospital support group and OP services. Mom has [place] WIC and will need [facility] prior to discharge.   LC discussed with RN consult and the need to give supplementation d/t early term status, acting like LPTI, and medical risks factors.  RN to get formula for feeding.  Maternal Data Has patient been taught Hand Expression?: Yes(with return demonstration and observation of colostrum ) Does the patient have breastfeeding experience prior to this delivery?: Yes  Feeding Feeding Type: Breast Fed Length of feed: 2 min  LATCH Score Latch: Repeated attempts needed to sustain latch, nipple held in mouth throughout feeding, stimulation needed to elicit sucking reflex.  Audible Swallowing: A few with stimulation  Type of Nipple: Everted at rest and after stimulation  Comfort (Breast/Nipple): Soft / non-tender  Hold (Positioning): Assistance needed to correctly position infant at breast and maintain latch.  LATCH Score: 7  Interventions Interventions: Breast feeding basics reviewed;Assisted with latch;Skin to skin;Hand express;Breast compression;Expressed milk;Support pillows;Adjust position  Lactation Tools Discussed/Used WIC Program: Yes Pump Review: Setup, frequency, and cleaning;Milk Storage Initiated by:: PBrown RN Date initiated:: 11/18/16   Consult Status Consult Status: Follow-up    Merlene Laughter 11/18/2016, 11:18 PM

## 2016-11-18 NOTE — Progress Notes (Signed)
Labor Progress Note  Jacqueline [name] is a 36 y.o. [id number] at [redacted]w[redacted]d  admitted for PPROM.  S: Comfortable and resting now with epidural.  Of note, patient states that she made it to 4cm with her last baby and then had c/s. Unable to find records.  O:  BP 123/67   Pulse 79   Temp 98.2 F (36.8 C) (Oral)   Resp 17   Ht 5\' 4"  (1.626 m)   Wt 80.3 kg (177 lb)   LMP 03/04/2016 (Exact Date)   SpO2 99%   BMI 30.38 kg/m   No intake/output data recorded.  FHT:  FHR: 140 bpm, variability: moderate,  accelerations:  Abscent,  decelerations:  Absent UC:   regular, every 3-5 minutes SVE:   FB in place but able to check external os which was 2.5cm  Pitocin @ 6 mu/min  Labs: Lab Results  Component Value Date   WBC 8.6 11/17/2016   HGB 12.1 11/17/2016   HCT 34.5 (L) 11/17/2016   MCV 88.5 11/17/2016   PLT 143 (L) 11/17/2016    Assessment / Plan: 36 y.o. [id number] [redacted]w[redacted]d not in labor. TOLAC. PPROM  Labor: Progressing normally, FB in place Fetal Wellbeing:  Category I Pain Control:  Epidural Anticipated MOD:  NSVD  Expectant management   Luiz Blare, DO OB Fellow

## 2016-11-18 NOTE — Consult Note (Signed)
[facility]  Delivery Note:  SVD    11/18/2016  11:58 AM  I was called to the delivery room at the request of the patient's midwife Koren Shiver) for respiratory distress in a newborn.  Marland Kitchen  PRENATAL HX:  This is a 36 y/o [id number] at 36 and 0/[redacted] weeks gestation.  She was admitted for PPROM (ROM x 40 hours) and infant was delivered by VBAC.  She is GBS positive and received adequate treatment with Vancomycin (she is penicillin allergic).  Her pregnancy has also been complicated by A2 Gestational Diabetes.    DELIVERY:  Per Labor and Delivery staff, Infant was vigorous at delivery, initially requiring no resuscitation other than standard warming, drying and suction.  However, he continued to have signs of nasal obstruction and was cyanotic, so blow by oxygen was administered and NICU was called to evaluate.  The NICU team arrived at around 6 minutes of age at which time blow by oxygen 30% was being administered and O2 saturations were in the mid 90s.  Oxygen was removed and O2 saturations decreased to upper 80s, but then slowly rose to mid 90s by 10 minutes of age.   APGARs 7 and 8.  After 15 minutes, baby left with nurse to assist parents with skin-to-skin care.   _____________________ Electronically Signed By: Clinton Gallant, MD Neonatologist

## 2016-11-18 NOTE — Anesthesia Pain Management Evaluation Note (Signed)
  CRNA Pain Management Visit Note  Patient: Jacqueline [name], 36 y.o., female  "Hello I am a member of the anesthesia team at [facility]. We have an anesthesia team available at all times to provide care throughout the hospital, including epidural management and anesthesia for C-section. I don't know your plan for the delivery whether it a natural birth, water birth, IV sedation, nitrous supplementation, doula or epidural, but we want to meet your pain goals."   1.Was your pain managed to your expectations on prior hospitalizations?   Yes   2.What is your expectation for pain management during this hospitalization?     Epidural  3.How can we help you reach that goal? Epidural  Record the patient's initial score and the patient's pain goal.   Pain: 3  Pain Goal: 5 [facility] wants you to be able to say your pain was always managed very well.  Jacqueline [name] 11/18/2016

## 2016-11-18 NOTE — Progress Notes (Signed)
IFSC removed per CNM tip intact

## 2016-11-19 LAB — GLUCOSE, CAPILLARY: GLUCOSE-CAPILLARY: 100 mg/dL — AB (ref 65–99)

## 2016-11-19 NOTE — Progress Notes (Signed)
MOB was referred for history of depression/anxiety. * Referral screened out by Clinical Social Worker because none of the following criteria appear to apply: ~ History of anxiety/depression during this pregnancy, or of post-partum depression. ~ Diagnosis of anxiety and/or depression within last 3 years OR * MOB's symptoms currently being treated with medication and/or therapy; MOB currently taking Zoloft.   Please contact the Clinical Social Worker if needs arise, by [facility] request, or if MOB scores greater than 9/yes to question 10 on Edinburgh Postpartum Depression Screen.  Laurey Arrow, MSW, LCSW Clinical Social Work [phone]

## 2016-11-19 NOTE — Progress Notes (Signed)
Post Partum Day 1 Subjective: no complaints, up ad lib, voiding, tolerating PO and + flatus  Objective: Blood pressure (!) 105/53, pulse 75, temperature 98.2 F (36.8 C), temperature source Oral, resp. rate 18, height 5\' 4"  (1.626 m), weight 80.3 kg (177 lb), last menstrual period 03/04/2016, SpO2 99 %, unknown if currently breastfeeding.  Physical Exam:  General: alert, cooperative and no distress Lochia: appropriate Uterine Fundus: firm at umbilicus Incision: NA DVT Evaluation: No evidence of DVT seen on physical exam. Minimal peripheral edema; improving per patient.   Recent Labs    11/17/16 1715  HGB 12.1  HCT 34.5*   Results for orders placed or performed during the hospital encounter of 11/17/16 (from the past 24 hour(s))  Glucose, capillary     Status: None   Collection Time: 11/18/16 10:16 AM  Result Value Ref Range   Glucose-Capillary 82 65 - 99 mg/dL  Glucose, capillary     Status: Abnormal   Collection Time: 11/19/16  7:45 AM  Result Value Ref Range   Glucose-Capillary 100 (H) 65 - 99 mg/dL    Assessment/Plan: Plan for discharge tomorrow, Breastfeeding and Contraception (OCPs preferred over Nexplanon/Merena). Circumcision planned with outside provider. Plans to discuss formula supplementation with pediatrician depending on breastfeeding success.    LOS: 2 days   Angela Cox [facility] 11/19/2016, 8:50 AM

## 2016-11-19 NOTE — Anesthesia Postprocedure Evaluation (Signed)
Anesthesia Post Note  Patient: Jacqueline [name]  Procedure(s) Performed: AN AD [place]     Patient location during evaluation: Mother Baby Anesthesia Type: Epidural Level of consciousness: awake and alert and oriented Pain management: satisfactory to patient Vital Signs Assessment: post-procedure vital signs reviewed and stable Respiratory status: respiratory function stable Cardiovascular status: stable Postop Assessment: no headache, no backache, epidural receding, patient able to bend at knees, no signs of nausea or vomiting and adequate PO intake Anesthetic complications: no    Last Vitals:  Vitals:   11/18/16 2015 11/19/16 0515  BP: 112/62 (!) 105/53  Pulse: 73 75  Resp: 20 18  Temp: 37 C 36.8 C  SpO2:      Last Pain:  Vitals:   11/19/16 0700  TempSrc:   PainSc: Asleep   Pain Goal:                 [name]

## 2016-11-19 NOTE — Lactation Note (Signed)
This note was copied from a baby's chart. Lactation Consultation Note  Patient Name: Boy Caroleen Stoermer [id number] Date: 11/19/2016 Reason for consult: Follow-up assessment;Early term 37-38.6wks;Hyperbilirubinemia   Follow up with mom of 62 hour old infant. Infant with 4 BF attempts, EBM x 2 of 0.5-1 cc, formula x 3 via bottle of 5-15 cc, 3 voids and 4 stools in last 244 hours.   Mom reports infant has been sleepy and not willing to feed well today. Infant is under phototherapy. Enc mom to keep infant on phototherapy even with BF.   Mom reports she pumped once last night with DEBP, she has done some hand expression. Enc mom to pump every 2-3 hours and follow with hand expression. Reviewed supply and demand and milk coming to volume with mom and importance of frequent breast stimulation to promote milk production. Reviewed supplementation amounts with mom based on day of age. Reviewed early term infant feeding behavior and Enc mom to keep infant familiar with breast, supplement infant with each feeding and protect milk supply with pumping.   Mom attempted to latch infant to right breast in the football hold. Assisted mom with pillow support. Mom did well with hand expression and small gtt noted to right breast. Infant nuzzled with 2-3 suckles. Mom tried about 10 minutes and then offered bottle of formula to infant. Mom was feeding infant when [place] left room.    Plan reviewed and written on white board in room:  Enc mom to offer breast with each feeding Enc mom to follow BF with supplementation with EBM/formula Follow supplementation with pumping x 15 minutes on Initiate setting Follow pumping with hand expression  Mom reports she has no further questions/concerns at this time. Enc mom to call out for assistance as needed.     Maternal Data Does the patient have breastfeeding experience prior to this delivery?: Yes  Feeding Feeding Type: Breast Fed Length of feed: 0 min  LATCH  Score Latch: Too sleepy or reluctant, no latch achieved, no sucking elicited.  Audible Swallowing: None  Type of Nipple: Everted at rest and after stimulation  Comfort (Breast/Nipple): Soft / non-tender  Hold (Positioning): No assistance needed to correctly position infant at breast.  LATCH Score: 6  Interventions Interventions: Breast feeding basics reviewed;Support pillows;Assisted with latch;Position options;Skin to skin;Expressed milk;Hand express;Breast compression  Lactation Tools Discussed/Used WIC Program: Yes Pump Review: Setup, frequency, and cleaning;Milk Storage Initiated by:: Reviewed and encouraged every 2-3 hours post BF   Consult Status Consult Status: Follow-up Date: 11/20/16 Follow-up type: In-patient    Debby Freiberg Patryck Kilgore 11/19/2016, 3:32 PM

## 2016-11-20 ENCOUNTER — Ambulatory Visit: Payer: Self-pay

## 2016-11-20 ENCOUNTER — Other Ambulatory Visit: Payer: Self-pay

## 2016-11-20 DIAGNOSIS — O34219 Maternal care for unspecified type scar from previous cesarean delivery: Secondary | ICD-10-CM

## 2016-11-20 MED ORDER — IBUPROFEN 600 MG PO TABS: 600.0000 mg | ORAL_TABLET | Freq: Four times a day (QID) | ORAL | 0 refills | Status: DC

## 2016-11-20 NOTE — Discharge Summary (Signed)
OB Discharge Summary     Patient Name: Jacqueline [name] DOB: 1980-07-10 MRN: [id number]  Date of admission: 11/17/2016 Delivering MD: Koren Shiver D   Date of discharge: 11/20/2016  Admitting diagnosis: 35 WKS, LEAKAGE WITH BLOOD Intrauterine pregnancy: [redacted]w[redacted]d     Secondary diagnosis:  Principal Problem:   VBAC, delivered, current hospitalization Active Problems:   Previous cesarean delivery, antepartum   Gestational diabetes mellitus (GDM) in third trimester controlled on oral hypoglycemic drug   Preterm premature rupture of membranes  Additional problems:  Patient Active Problem List   Diagnosis Date Noted  . VBAC, delivered, current hospitalization 11/20/2016  . Preterm premature rupture of membranes 11/17/2016  . Gestational diabetes mellitus (GDM) in third trimester controlled on oral hypoglycemic drug 10/16/2016  . Supervision of other normal pregnancy, antepartum 05/30/2016  . Advanced maternal age in multigravida 05/30/2016  . Previous cesarean delivery, antepartum 05/30/2016  . Seasonal allergies 05/30/2016       Discharge diagnosis: VBAC                                                                                                Post partum procedures:none  Augmentation: Pitocin  Complications: None  Hospital course:  Induction of Labor With Vaginal Delivery   36 y.o. yo [id number] at [redacted]w[redacted]d was admitted to the hospital 11/17/2016 for induction of labor.  Indication for induction: PPROM at [redacted]w[redacted]d. She was induced with FB and Pitocin. Patient had an uncomplicated labor course as follows: Membrane Rupture Time/Date: 8:00 PM ,11/16/2016   Intrapartum Procedures: Episiotomy: None [1]                                         Lacerations:  1st degree [2];Vaginal [6]  Patient had delivery of a Viable infant.  Information for the patient's newborn:  Emmalynne, Courtney [[id number]]  Delivery Method: VBAC, Spontaneous(Filed from Delivery Summary)   11/18/2016   Details of delivery can be found in separate delivery note.  Patient had a routine postpartum course. Her postpartum fating BG was normal. Patient is discharged home 11/20/16.  Physical exam  Vitals:   11/18/16 2015 11/19/16 0515 11/19/16 1846 11/20/16 0527  BP: 112/62 (!) 105/53 (!) 117/59 (!) 109/58  Pulse: 73 75 85 74  Resp: 20 18 18 20   Temp: 98.6 F (37 C) 98.2 F (36.8 C) 98.6 F (37 C) 98.1 F (36.7 C)  TempSrc: Oral Oral Oral Oral  SpO2:   100%   Weight:      Height:       General: alert, cooperative and no distress Lochia: appropriate Uterine Fundus: firm Incision: N/A DVT Evaluation: No evidence of DVT seen on physical exam. No significant calf/ankle edema. Labs: Lab Results  Component Value Date   WBC 8.6 11/17/2016   HGB 12.1 11/17/2016   HCT 34.5 (L) 11/17/2016   MCV 88.5 11/17/2016   PLT 143 (L) 11/17/2016   No flowsheet data found.  Discharge instruction: per After Visit Summary and "Baby and  Me Booklet".  After visit meds:  Allergies as of 11/20/2016      Reactions   Penicillins Hives, Shortness Of Breath, Other (See Comments)   Has patient had a PCN reaction causing immediate rash, facial/tongue/throat swelling, SOB or lightheadedness with hypotension: Yes Has patient had a PCN reaction causing severe rash involving mucus membranes or skin necrosis: No Has patient had a PCN reaction that required hospitalization No Has patient had a PCN reaction occurring within the last 10 years: Yes If all of the above answers are "NO", then may proceed with Cephalosporin use.      Medication List    STOP taking these medications   glyBURIDE 1.25 MG tablet Commonly known as:  DIABETA     TAKE these medications   cetirizine 10 MG tablet Commonly known as:  ZYRTEC Take 10 mg by mouth daily.   fluticasone 50 MCG/ACT nasal spray Commonly known as:  FLONASE Place 1 spray into both nostrils daily.   ibuprofen 600 MG tablet Commonly known as:   ADVIL,MOTRIN Take 1 tablet (600 mg total) every 6 (six) hours by mouth.   multivitamin-prenatal 27-0.8 MG Tabs tablet Take 1 tablet by mouth daily at 12 noon.   sertraline 50 MG tablet Commonly known as:  ZOLOFT Take 1 tablet (50 mg total) by mouth daily.       Diet: routine diet  Activity: Advance as tolerated. Pelvic rest for 6 weeks.   Outpatient follow up: Future Appointments  Date Time Provider [place]  11/21/2016  1:45 PM [place] 2 [facility]  12/17/2016  8:30 AM Lavonia Drafts, MD [facility] None  --Will need 2-hr GTT  Postpartum contraception: Progesterone only pills and Natural Family Planning  Newborn Data: Live born female  Birth Weight: 6 lb 9.3 oz (2985 g) APGAR: 7, 8  Newborn Delivery   Time head delivered:  11/18/2016 11:32:00 Birth date/time:  11/18/2016 11:33:00 Delivery type:  VBAC, Spontaneous     Baby Feeding: Breast Disposition:pending discharge due to hyperbilirbin (on phototherapy this morning).   11/20/2016 Gailen Shelter, MD

## 2016-11-20 NOTE — Lactation Note (Signed)
This note was copied from a baby's chart. Lactation Consultation Note  Patient Name: Jacqueline [name] [id number] Date: 11/20/2016 Reason for consult: Follow-up assessment;Other (Comment)([name] shared with this LC was to tired to see [name] today and requested before D/C tomorrow )   Maternal Data    Feeding Feeding Type: Bottle Fed - Formula Nipple Type: Slow - flow  LATCH Score                   Interventions Interventions: Breast feeding basics reviewed  Lactation Tools Discussed/Used     Consult Status Consult Status: Follow-up Date: 11/20/16 Follow-up type: In-patient    [place] 11/20/2016, 4:09 PM

## 2016-11-20 NOTE — Discharge Instructions (Signed)
Postpartum Care After Vaginal Delivery °The period of time right after you deliver your newborn is called the postpartum period. °What kind of medical care will I receive? °· You may continue to receive fluids and medicines through an IV tube inserted into one of your veins. °· If an incision was made near your vagina (episiotomy) or if you had some vaginal tearing during delivery, cold compresses may be placed on your episiotomy or your tear. This helps to reduce pain and swelling. °· You may be given a squirt bottle to use when you go to the bathroom. You may use this until you are comfortable wiping as usual. To use the squirt bottle, follow these steps: °? Before you urinate, fill the squirt bottle with warm water. Do not use hot water. °? After you urinate, while you are sitting on the toilet, use the squirt bottle to rinse the area around your urethra and vaginal opening. This rinses away any urine and blood. °? You may do this instead of wiping. As you start healing, you may use the squirt bottle before wiping yourself. Make sure to wipe gently. °? Fill the squirt bottle with clean water every time you use the bathroom. °· You will be given sanitary pads to wear. °How can I expect to feel? °· You may not feel the need to urinate for several hours after delivery. °· You will have some soreness and pain in your abdomen and vagina. °· If you are breastfeeding, you may have uterine contractions every time you breastfeed for up to several weeks postpartum. Uterine contractions help your uterus return to its normal size. °· It is normal to have vaginal bleeding (lochia) after delivery. The amount and appearance of lochia is often similar to a menstrual period in the first week after delivery. It will gradually decrease over the next few weeks to a dry, yellow-brown discharge. For most women, lochia stops completely by 6-8 weeks after delivery. Vaginal bleeding can vary from woman to woman. °· Within the first few  days after delivery, you may have breast engorgement. This is when your breasts feel heavy, full, and uncomfortable. Your breasts may also throb and feel hard, tightly stretched, warm, and tender. After this occurs, you may have milk leaking from your breasts. Your health care provider can help you relieve discomfort due to breast engorgement. Breast engorgement should go away within a few days. °· You may feel more sad or worried than normal due to hormonal changes after delivery. These feelings should not last more than a few days. If these feelings do not go away after several days, speak with your health care provider. °How should I care for myself? °· Tell your health care provider if you have pain or discomfort. °· Drink enough water to keep your urine clear or pale yellow. °· Wash your hands thoroughly with soap and water for at least 20 seconds after changing your sanitary pads, after using the toilet, and before holding or feeding your baby. °· If you are not breastfeeding, avoid touching your breasts a lot. Doing this can make your breasts produce more milk. °· If you become weak or lightheaded, or you feel like you might faint, ask for help before: °? Getting out of bed. °? Showering. °· Change your sanitary pads frequently. Watch for any changes in your flow, such as a sudden increase in volume, a change in color, the passing of large blood clots. If you pass a blood clot from your vagina, save it   to show to your health care provider. Do not flush blood clots down the toilet without having your health care provider look at them.  Make sure that all your vaccinations are up to date. This can help protect you and your baby from getting certain diseases. You may need to have immunizations done before you leave the hospital.  If desired, talk with your health care provider about methods of family planning or birth control (contraception). How can I start bonding with my baby? Spending as much time as  possible with your baby is very important. During this time, you and your baby can get to know each other and develop a bond. Having your baby stay with you in your room (rooming in) can give you time to get to know your baby. Rooming in can also help you become comfortable caring for your baby. Breastfeeding can also help you bond with your baby. How can I plan for returning home with my baby?  Make sure that you have a car seat installed in your vehicle. ? Your car seat should be checked by a certified car seat installer to make sure that it is installed safely. ? Make sure that your baby fits into the car seat safely.  Ask your health care provider any questions you have about caring for yourself or your baby. Make sure that you are able to contact your health care provider with any questions after leaving the hospital. This information is not intended to replace advice given to you by your health care provider. Make sure you discuss any questions you have with your health care provider. Document Released: 10/29/2006 Document Revised: 06/06/2015 Document Reviewed: 12/06/2014 Elsevier Interactive Patient Education  2018 [employer].  Breastfeeding Deciding to breastfeed is one of the best choices you can make for you and your baby. A change in hormones during pregnancy causes your breast tissue to grow and increases the number and size of your milk ducts. These hormones also allow proteins, sugars, and fats from your blood supply to make breast milk in your milk-producing glands. Hormones prevent breast milk from being released before your baby is born as well as prompt milk flow after birth. Once breastfeeding has begun, thoughts of your baby, as well as his or her sucking or crying, can stimulate the release of milk from your milk-producing glands. Benefits of breastfeeding For Your Baby  Your first milk (colostrum) helps your baby's digestive system function better.  There are antibodies in  your milk that help your baby fight off infections.  Your baby has a lower incidence of asthma, allergies, and sudden infant death syndrome.  The nutrients in breast milk are better for your baby than infant formulas and are designed uniquely for your babys needs.  Breast milk improves your baby's brain development.  Your baby is less likely to develop other conditions, such as childhood obesity, asthma, or type 2 diabetes mellitus.  For You  Breastfeeding helps to create a very special bond between you and your baby.  Breastfeeding is convenient. Breast milk is always available at the correct temperature and costs nothing.  Breastfeeding helps to burn calories and helps you lose the weight gained during pregnancy.  Breastfeeding makes your uterus contract to its prepregnancy size faster and slows bleeding (lochia) after you give birth.  Breastfeeding helps to lower your risk of developing type 2 diabetes mellitus, osteoporosis, and breast or ovarian cancer later in life.  Signs that your baby is hungry Early Signs of  Hunger  Increased alertness or activity.  Stretching.  Movement of the head from side to side.  Movement of the head and opening of the mouth when the corner of the mouth or cheek is stroked (rooting).  Increased sucking sounds, smacking lips, cooing, sighing, or squeaking.  Hand-to-mouth movements.  Increased sucking of fingers or hands.  Late Signs of Hunger  Fussing.  Intermittent crying.  Extreme Signs of Hunger Signs of extreme hunger will require calming and consoling before your baby will be able to breastfeed successfully. Do not wait for the following signs of extreme hunger to occur before you initiate breastfeeding:  Restlessness.  A loud, strong cry.  Screaming.  Breastfeeding basics Breastfeeding Initiation  Find a comfortable place to sit or lie down, with your neck and back well supported.  Place a pillow or rolled up blanket  under your baby to bring him or her to the level of your breast (if you are seated). Nursing pillows are specially designed to help support your arms and your baby while you breastfeed.  Make sure that your baby's abdomen is facing your abdomen.  Gently massage your breast. With your fingertips, massage from your chest wall toward your nipple in a circular motion. This encourages milk flow. You may need to continue this action during the feeding if your milk flows slowly.  Support your breast with 4 fingers underneath and your thumb above your nipple. Make sure your fingers are well away from your nipple and your babys mouth.  Stroke your baby's lips gently with your finger or nipple.  When your baby's mouth is open wide enough, quickly bring your baby to your breast, placing your entire nipple and as much of the colored area around your nipple (areola) as possible into your baby's mouth. ? More areola should be visible above your baby's upper lip than below the lower lip. ? Your baby's tongue should be between his or her lower gum and your breast.  Ensure that your baby's mouth is correctly positioned around your nipple (latched). Your baby's lips should create a seal on your breast and be turned out (everted).  It is common for your baby to suck about 2-3 minutes in order to start the flow of breast milk.  Latching Teaching your baby how to latch on to your breast properly is very important. An improper latch can cause nipple pain and decreased milk supply for you and poor weight gain in your baby. Also, if your baby is not latched onto your nipple properly, he or she may swallow some air during feeding. This can make your baby fussy. Burping your baby when you switch breasts during the feeding can help to get rid of the air. However, teaching your baby to latch on properly is still the best way to prevent fussiness from swallowing air while breastfeeding. Signs that your baby has successfully  latched on to your nipple:  Silent tugging or silent sucking, without causing you pain.  Swallowing heard between every 3-4 sucks.  Muscle movement above and in front of his or her ears while sucking.  Signs that your baby has not successfully latched on to nipple:  Sucking sounds or smacking sounds from your baby while breastfeeding.  Nipple pain.  If you think your baby has not latched on correctly, slip your finger into the corner of your babys mouth to break the suction and place it between your baby's gums. Attempt breastfeeding initiation again. Signs of Successful Breastfeeding Signs from your  baby:  A gradual decrease in the number of sucks or complete cessation of sucking.  Falling asleep.  Relaxation of his or her body.  Retention of a small amount of milk in his or her mouth.  Letting go of your breast by himself or herself.  Signs from you:  Breasts that have increased in firmness, weight, and size 1-3 hours after feeding.  Breasts that are softer immediately after breastfeeding.  Increased milk volume, as well as a change in milk consistency and color by the fifth day of breastfeeding.  Nipples that are not sore, cracked, or bleeding.  Signs That Your Randel Books is Getting Enough Milk  Wetting at least 1-2 diapers during the first 24 hours after birth.  Wetting at least 5-6 diapers every 24 hours for the first week after birth. The urine should be clear or pale yellow by 5 days after birth.  Wetting 6-8 diapers every 24 hours as your baby continues to grow and develop.  At least 3 stools in a 24-hour period by age 35 days. The stool should be soft and yellow.  At least 3 stools in a 24-hour period by age 757 days. The stool should be seedy and yellow.  No loss of weight greater than 10% of birth weight during the first 38 days of age.  Average weight gain of 4-7 ounces (113-198 g) per week after age 75 days.  Consistent daily weight gain by age 17 days, without  weight loss after the age of 2 weeks.  After a feeding, your baby may spit up a small amount. This is common. Breastfeeding frequency and duration Frequent feeding will help you make more milk and can prevent sore nipples and breast engorgement. Breastfeed when you feel the need to reduce the fullness of your breasts or when your baby shows signs of hunger. This is called "breastfeeding on demand." Avoid introducing a pacifier to your baby while you are working to establish breastfeeding (the first 4-6 weeks after your baby is born). After this time you may choose to use a pacifier. Research has shown that pacifier use during the first year of a baby's life decreases the risk of sudden infant death syndrome (SIDS). Allow your baby to feed on each breast as long as he or she wants. Breastfeed until your baby is finished feeding. When your baby unlatches or falls asleep while feeding from the first breast, offer the second breast. Because newborns are often sleepy in the first few weeks of life, you may need to awaken your baby to get him or her to feed. Breastfeeding times will vary from baby to baby. However, the following rules can serve as a guide to help you ensure that your baby is properly fed:  Newborns (babies 19 weeks of age or younger) may breastfeed every 1-3 hours.  Newborns should not go longer than 3 hours during the day or 5 hours during the night without breastfeeding.  You should breastfeed your baby a minimum of 8 times in a 24-hour period until you begin to introduce solid foods to your baby at around 80 months of age.  Breast milk pumping Pumping and storing breast milk allows you to ensure that your baby is exclusively fed your breast milk, even at times when you are unable to breastfeed. This is especially important if you are going back to work while you are still breastfeeding or when you are not able to be present during feedings. Your lactation consultant can give you guidelines  on how long it is safe to store breast milk. A breast pump is a machine that allows you to pump milk from your breast into a sterile bottle. The pumped breast milk can then be stored in a refrigerator or freezer. Some breast pumps are operated by hand, while others use electricity. Ask your lactation consultant which type will work best for you. Breast pumps can be purchased, but some hospitals and breastfeeding support groups lease breast pumps on a monthly basis. A lactation consultant can teach you how to hand express breast milk, if you prefer not to use a pump. Caring for your breasts while you breastfeed Nipples can become dry, cracked, and sore while breastfeeding. The following recommendations can help keep your breasts moisturized and healthy:  Avoid using soap on your nipples.  Wear a supportive bra. Although not required, special nursing bras and tank tops are designed to allow access to your breasts for breastfeeding without taking off your entire bra or top. Avoid wearing underwire-style bras or extremely tight bras.  Air dry your nipples for 3-18minutes after each feeding.  Use only cotton bra pads to absorb leaked breast milk. Leaking of breast milk between feedings is normal.  Use lanolin on your nipples after breastfeeding. Lanolin helps to maintain your skin's normal moisture barrier. If you use pure lanolin, you do not need to wash it off before feeding your baby again. Pure lanolin is not toxic to your baby. You may also hand express a few drops of breast milk and gently massage that milk into your nipples and allow the milk to air dry.  In the first few weeks after giving birth, some women experience extremely full breasts (engorgement). Engorgement can make your breasts feel heavy, warm, and tender to the touch. Engorgement peaks within 3-5 days after you give birth. The following recommendations can help ease engorgement:  Completely empty your breasts while breastfeeding or  pumping. You may want to start by applying warm, moist heat (in the shower or with warm water-soaked hand towels) just before feeding or pumping. This increases circulation and helps the milk flow. If your baby does not completely empty your breasts while breastfeeding, pump any extra milk after he or she is finished.  Wear a snug bra (nursing or regular) or tank top for 1-2 days to signal your body to slightly decrease milk production.  Apply ice packs to your breasts, unless this is too uncomfortable for you.  Make sure that your baby is latched on and positioned properly while breastfeeding.  If engorgement persists after 48 hours of following these recommendations, contact your health care provider or a Science writer. Overall health care recommendations while breastfeeding  Eat healthy foods. Alternate between meals and snacks, eating 3 of each per day. Because what you eat affects your breast milk, some of the foods may make your baby more irritable than usual. Avoid eating these foods if you are sure that they are negatively affecting your baby.  Drink milk, fruit juice, and water to satisfy your thirst (about 10 glasses a day).  Rest often, relax, and continue to take your prenatal vitamins to prevent fatigue, stress, and anemia.  Continue breast self-awareness checks.  Avoid chewing and smoking tobacco. Chemicals from cigarettes that pass into breast milk and exposure to secondhand smoke may harm your baby.  Avoid alcohol and drug use, including marijuana. Some medicines that may be harmful to your baby can pass through breast milk. It is important to ask your health  care provider before taking any medicine, including all over-the-counter and prescription medicine as well as vitamin and herbal supplements. It is possible to become pregnant while breastfeeding. If birth control is desired, ask your health care provider about options that will be safe for your baby. Contact a  health care provider if:  You feel like you want to stop breastfeeding or have become frustrated with breastfeeding.  You have painful breasts or nipples.  Your nipples are cracked or bleeding.  Your breasts are red, tender, or warm.  You have a swollen area on either breast.  You have a fever or chills.  You have nausea or vomiting.  You have drainage other than breast milk from your nipples.  Your breasts do not become full before feedings by the fifth day after you give birth.  You feel sad and depressed.  Your baby is too sleepy to eat well.  Your baby is having trouble sleeping.  Your baby is wetting less than 3 diapers in a 24-hour period.  Your baby has less than 3 stools in a 24-hour period.  Your baby's skin or the white part of his or her eyes becomes yellow.  Your baby is not gaining weight by 68 days of age. Get help right away if:  Your baby is overly tired (lethargic) and does not want to wake up and feed.  Your baby develops an unexplained fever. This information is not intended to replace advice given to you by your health care provider. Make sure you discuss any questions you have with your health care provider. Document Released: 01/01/2005 Document Revised: 06/15/2015 Document Reviewed: 06/25/2012 Elsevier Interactive Patient Education  2017 [employer].

## 2016-11-20 NOTE — Plan of Care (Signed)
Patient states her bleeding is much lighter now and she is voiding without issues.  She also stated she would like to bottle feed formula only today because the baby is very sleepy at the breast and when she pumps she does not get anything.  Education provided regarding the importance of pumping for stimulation, as well as emotional support provided.  Will continue to monitor.

## 2016-11-21 ENCOUNTER — Ambulatory Visit: Payer: Self-pay

## 2016-11-21 ENCOUNTER — Ambulatory Visit (HOSPITAL_COMMUNITY): Payer: Medicaid Other | Attending: Family Medicine

## 2016-11-21 NOTE — Lactation Note (Signed)
This note was copied from a baby's chart. Lactation Consultation Note  Patient Name: Jacqueline [name] [id number] Date: 11/21/2016 Reason for consult: Follow-up assessment;Early term 37-38.6wks;Infant weight loss;Hyperbilirubinemia;Primapara;1st time breastfeeding;Mother's request Randel Books is 70 hours old,  Per mom baby hasn't been to the breast since Monday and hasn't pumped since Monday. Was really tired yesterday and needed sleep.  LC explored options with mom and mom desired to latch her baby today since her milk is in.  [facility] had mom hand express, and pre- pump with hand pump, and reverse pressure. Baby latched with latch score of 8.  Mom really pleased baby latched and is feeding so well and voiced appreciation for review and assistance to latch .  Sore nipple and engorgement prevention and tx reviewed.  Per mom has signed up with [place] [facility] [place] yesterday, [place] faxed a DEBP request to [facility], and when [place] was assisting mom WIC called,  Mom responded she would call [place] back.  LC reviewed early term infant potential feeding behavior, and since the baby has received so many bottles, and may  need to give the baby and appetizer Of EBM or formula 10 ml and then feed.  LC discussed nutritive vs non nutritive sucking patterns and to watch for hanging out when latched.   Mom receptive  to coming back for [facility] O/P appt. LC placed in the basket for OB/GYN clinic for request for appt . Next week.   Waiting on [facility] to contact mom for [facility] loaner .     Maternal Data Has patient been taught Hand Expression?: Yes(pre-pumped with hand pump due to being over full )  Feeding Feeding Type: Breast Fed Length of feed: 25 min(multiple swallows , increased with breast compressions )  LATCH Score Latch: Grasps breast easily, tongue down, lips flanged, rhythmical sucking.  Audible Swallowing: Spontaneous and intermittent  Type of Nipple: Everted at rest and after stimulation  Comfort (Breast/Nipple): Filling,  red/small blisters or bruises, mild/mod discomfort  Hold (Positioning): Assistance needed to correctly position infant at breast and maintain latch.  LATCH Score: 8  Interventions Interventions: Breast feeding basics reviewed;Assisted with latch;Skin to skin;Breast massage;Hand express;Reverse pressure;Pre-pump if needed;Breast compression;Adjust position;Support pillows;Position options;Expressed milk;Hand pump;DEBP  Lactation Tools Discussed/Used Tools: Coconut oil Shell Type: Inverted Breast pump type: Double-Electric Breast Pump WIC Program: Yes   Consult Status Consult Status: Follow-up Date: (mom receptive  to return for lc o/p APPT ) Follow-up type: Out-patient(at [facility] clinic for [facility] O/P appt )    [place] 11/21/2016, 10:14 AM

## 2016-11-22 ENCOUNTER — Ambulatory Visit: Payer: Self-pay

## 2016-11-22 NOTE — Lactation Note (Signed)
This note was copied from a baby's chart. Lactation Consultation Note  Patient Name: Jacqueline [name] Date: 11/22/2016 Reason for consult: Follow-up assessment;Engorgement   Mother's breast are filling.  Engorgement has improved. Mother states she has not been breastfeeding due to areola edema. Provided mother with manual pump and mother was able to relieve some of swelling by prepumping which helped evert nipple. Also demonstrated how to use piston for single and double manual pumping. Mother would like [facility] loaner pump. Provided paperwork for loaner and told mother to call with latch and for pump. Mother has been pumping 57ml + and bottle feeding baby. Encouraged mother to continue working on breastfeeding and call [facility] when baby cues.   Maternal Data    Feeding    LATCH Score                   Interventions    Lactation Tools Discussed/Used     Consult Status Consult Status: Complete    Carlye Grippe 11/22/2016, 10:34 AM

## 2016-11-22 NOTE — Lactation Note (Addendum)
This note was copied from a baby's chart. Lactation Consultation Note Mom engorged. Unable to latch mom states because breast to large, short shaft, semi flat nipples. Breast hard w/knots. Lt. Breast more than a size larger than Rt.  Lt. Breast not able to get much out when pumped. Nipple and areola w/edema. Coconut oil rubbed on breast, massaged, reverse pressure to areola and nipple. Massage compression toward nipple, able to hand express milk only. Laid mom flat, applied cabbage. Asked RN to apply ice. Encouraged mom to rest for at least 1 hours then may get up if wished. Pump in 2 hrs. Mom has been giving baby formula. Encouraged mom to give BM and not formula.  Mom stated she didn't want to BF, and had so much milk she thought she would try, now engorged, isn't happy about all the milk. Discussed mom would have this situation that would be worse if isn't going to give BM. Mom stated she knew, its just painful. Discussed getting on top of this and not letting it happen anymore. PUMPING regularly. Patient Name: Boy Marygrace Sandoval Today's Date: 11/22/2016 Reason for consult: Engorgement   Maternal Data    Feeding    LATCH Score          Comfort (Breast/Nipple): Engorged, cracked, bleeding, large blisters, severe discomfort  Hold (Positioning): Full assist, staff holds infant at breast     Interventions Interventions: Reverse pressure;Coconut oil;Breast compression;Breast massage;Support pillows;Hand express;DEBP;Expressed milk;Ice(cabbage)  Lactation Tools Discussed/Used Tools: Coconut oil Breast pump type: Double-Electric Breast Pump   Consult Status Consult Status: Follow-up Date: 11/22/16 Follow-up type: In-patient    Theodoro Kalata 11/22/2016, 3:35 AM

## 2016-11-22 NOTE — Lactation Note (Signed)
This note was copied from a baby's chart. Lactation Consultation Note  Patient Name: Boy Shayann Garbutt [id number] Date: 11/22/2016 Reason for consult: Follow-up assessment;Engorgement   Baby 31 days old.  37 weeks.  Mother recently pumped 20 ml. Mother had been mostly bottle formula feeding but now that engorgement has resolved she is eager to breastfeed. Baby has been sucking on pacifier and now mother states he will not open wide.  Provided education.  Pacifier use not recommended at this time.  Attempted latching on L breast but baby does not suck and sustain latch. Applied #24 NS.  Prefilled w/ pumped breastmilk. Baby latched and sustained latch for 20 min.  Demonstrated how to use 5 french to give breastmilk while at the breast. Reviewed cleaning and milk storage. OP basket message has been made. Plan is for mother to breastfeed and post pump 3-4 times per day and give him volume back. Reviewed engorgement care and monitoring voids/stools. Mother received [place].    Maternal Data    Feeding    LATCH Score                   Interventions    Lactation Tools Discussed/Used     Consult Status Consult Status: Complete    Carlye Grippe 11/22/2016, 12:39 PM

## 2016-11-23 ENCOUNTER — Encounter: Payer: Medicaid Other | Admitting: Family Medicine

## 2016-11-27 ENCOUNTER — Other Ambulatory Visit: Payer: Medicaid Other

## 2016-11-29 ENCOUNTER — Telehealth: Payer: Self-pay

## 2016-11-29 NOTE — Telephone Encounter (Signed)
Patient called stating that she is two weeks postpartum and having a lot of swelling in her feet. Patient states she has tried elevating, hydrating with water, and eposom salt soaks.  Patient denies any headaches, dizziness or blurry vision. Patient scheduled for appointment tomorrow AM.   Kathrene Alu RNBSN

## 2016-11-30 ENCOUNTER — Encounter: Payer: Medicaid Other | Admitting: Family Medicine

## 2016-11-30 ENCOUNTER — Encounter: Payer: Self-pay | Admitting: Family Medicine

## 2016-11-30 ENCOUNTER — Other Ambulatory Visit (HOSPITAL_COMMUNITY)
Admission: RE | Admit: 2016-11-30 | Discharge: 2016-11-30 | Disposition: A | Discharge status: Home / Self Care | Payer: Medicaid Other | Source: Ambulatory Visit | Attending: Family Medicine | Admitting: Family Medicine

## 2016-11-30 ENCOUNTER — Ambulatory Visit: Payer: Medicaid Other | Admitting: Family Medicine

## 2016-11-30 VITALS — BP 150/82 | HR 57 | Wt 168.0 lb

## 2016-11-30 DIAGNOSIS — N898 Other specified noninflammatory disorders of vagina: Secondary | ICD-10-CM | POA: Insufficient documentation

## 2016-11-30 DIAGNOSIS — O165 Unspecified maternal hypertension, complicating the puerperium: Secondary | ICD-10-CM

## 2016-11-30 DIAGNOSIS — S30814A Abrasion of vagina and vulva, initial encounter: Secondary | ICD-10-CM

## 2016-11-30 MED ORDER — HYDROCHLOROTHIAZIDE 25 MG PO TABS: 25.0000 mg | ORAL_TABLET | Freq: Every day | ORAL | 0 refills | Status: DC

## 2016-11-30 NOTE — Progress Notes (Signed)
   Subjective:    Patient ID: Jacqueline [name], female    DOB: 1980-03-15, 36 y.o.   MRN: [id number]  HPI Patient seen 12 days postpartum from VBAC. She is complaining of swelling in feet bilaterally.  She denies headache, vision changes, shortness of breath.  She also complains of extreme burning when she urinates, particularly on the left side.  She tried Vaseline to help, but this was on helpful.  Burning is on the outside of her skin   Review of Systems    BP (!) 150/82   Pulse (!) 57   Wt 168 lb (76.2 kg)   LMP 03/04/2016 (Exact Date)   Breastfeeding? No   BMI 28.84 kg/m   Objective:   Physical Exam  Constitutional: She is oriented to person, place, and time. She appears well-developed and well-nourished.  Cardiovascular: Normal rate.  Pulmonary/Chest: Effort normal.  Genitourinary:     Neurological: She is alert and oriented to person, place, and time.  Skin: Skin is warm and dry.  Psychiatric: She has a normal mood and affect. Her behavior is normal. Judgment and thought content normal.       Assessment & Plan:  1. Abrasion of vagina, initial encounter Applied silver nitrate.  Discussed care.  We will follow-up in 2 weeks  2. Postpartum hypertension Prescribed hydrochlorothiazide 25 mg daily.  Will recheck in 2 weeks at postpartum visit.  Discussed warning symptoms of preeclampsia when to call.  3. Vaginal discharge - Cervicovaginal ancillary only

## 2016-11-30 NOTE — Progress Notes (Signed)
Patient complaining of swelling in feet. Kathrene Alu RNBSN

## 2016-12-04 ENCOUNTER — Other Ambulatory Visit: Payer: Medicaid Other

## 2016-12-04 LAB — CERVICOVAGINAL ANCILLARY ONLY
Bacterial vaginitis: NEGATIVE
CANDIDA VAGINITIS: NEGATIVE

## 2016-12-04 NOTE — Progress Notes (Signed)
Reviewed and agree Seabron Spates, CNM

## 2016-12-13 ENCOUNTER — Ambulatory Visit: Payer: Self-pay | Admitting: Family Medicine

## 2016-12-17 ENCOUNTER — Ambulatory Visit: Payer: Self-pay | Admitting: Obstetrics & Gynecology

## 2016-12-24 ENCOUNTER — Other Ambulatory Visit: Payer: Self-pay | Admitting: Family Medicine

## 2017-01-22 ENCOUNTER — Ambulatory Visit (INDEPENDENT_AMBULATORY_CARE_PROVIDER_SITE_OTHER): Payer: Medicaid Other | Admitting: Advanced Practice Midwife

## 2017-01-22 ENCOUNTER — Encounter: Payer: Self-pay | Admitting: Advanced Practice Midwife

## 2017-01-22 VITALS — BP 127/82 | HR 80 | Ht 64.0 in | Wt 169.0 lb

## 2017-01-22 DIAGNOSIS — Z30011 Encounter for initial prescription of contraceptive pills: Secondary | ICD-10-CM

## 2017-01-22 DIAGNOSIS — Z1389 Encounter for screening for other disorder: Secondary | ICD-10-CM | POA: Diagnosis not present

## 2017-01-22 DIAGNOSIS — J069 Acute upper respiratory infection, unspecified: Secondary | ICD-10-CM

## 2017-01-22 DIAGNOSIS — O34219 Maternal care for unspecified type scar from previous cesarean delivery: Secondary | ICD-10-CM

## 2017-01-22 MED ORDER — GUAIFENESIN ER 600 MG PO TB12: 600.0000 mg | ORAL_TABLET | Freq: Two times a day (BID) | ORAL | 1 refills | Status: DC

## 2017-01-22 MED ORDER — HYDROCOD POLST-CPM POLST ER 10-8 MG/5ML PO SUER: 5.0000 mL | Freq: Two times a day (BID) | ORAL | 0 refills | Status: DC | PRN

## 2017-01-22 MED ORDER — NORETHIN ACE-ETH ESTRAD-FE 1-20 MG-MCG(24) PO TABS: 1.0000 | ORAL_TABLET | Freq: Every day | ORAL | 11 refills | Status: DC

## 2017-01-22 NOTE — Patient Instructions (Signed)
Upper Respiratory Infection, Adult Most upper respiratory infections (URIs) are caused by a virus. A URI affects the nose, throat, and upper air passages. The most common type of URI is often called "the common cold." Follow these instructions at home:  Take medicines only as told by your doctor.  Gargle warm saltwater or take cough drops to comfort your throat as told by your doctor.  Use a warm mist humidifier or inhale steam from a shower to increase air moisture. This may make it easier to breathe.  Drink enough fluid to keep your pee (urine) clear or pale yellow.  Eat soups and other clear broths.  Have a healthy diet.  Rest as needed.  Go back to work when your fever is gone or your doctor says it is okay. ? You may need to stay home longer to avoid giving your URI to others. ? You can also wear a face mask and wash your hands often to prevent spread of the virus.  Use your inhaler more if you have asthma.  Do not use any tobacco products, including cigarettes, chewing tobacco, or electronic cigarettes. If you need help quitting, ask your doctor. Contact a doctor if:  You are getting worse, not better.  Your symptoms are not helped by medicine.  You have chills.  You are getting more short of breath.  You have brown or red mucus.  You have yellow or brown discharge from your nose.  You have pain in your face, especially when you bend forward.  You have a fever.  You have puffy (swollen) neck glands.  You have pain while swallowing.  You have white areas in the back of your throat. Get help right away if:  You have very bad or constant: ? Headache. ? Ear pain. ? Pain in your forehead, behind your eyes, and over your cheekbones (sinus pain). ? Chest pain.  You have long-lasting (chronic) lung disease and any of the following: ? Wheezing. ? Long-lasting cough. ? Coughing up blood. ? A change in your usual mucus.  You have a stiff neck.  You have  changes in your: ? Vision. ? Hearing. ? Thinking. ? Mood. This information is not intended to replace advice given to you by your health care provider. Make sure you discuss any questions you have with your health care provider. Document Released: 06/20/2007 Document Revised: 09/04/2015 Document Reviewed: 04/08/2013 Elsevier Interactive Patient Education  2018 [employer]. Oral Contraception Information Oral contraceptive pills (OCPs) are medicines taken to prevent pregnancy. OCPs work by preventing the ovaries from releasing eggs. The hormones in OCPs also cause the cervical mucus to thicken, preventing the sperm from entering the uterus. The hormones also cause the uterine lining to become thin, not allowing a fertilized egg to attach to the inside of the uterus. OCPs are highly effective when taken exactly as prescribed. However, OCPs do not prevent sexually transmitted diseases (STDs). Safe sex practices, such as using condoms along with the pill, can help prevent STDs. Before taking the pill, you may have a physical exam and Pap test. Your health care provider may order blood tests. The health care provider will make sure you are a good candidate for oral contraception. Discuss with your health care provider the possible side effects of the OCP you may be prescribed. When starting an OCP, it can take 2 to 3 months for the body to adjust to the changes in hormone levels in your body. Types of oral contraception  The combination  pill-This pill contains estrogen and progestin (synthetic progesterone) hormones. The combination pill comes in 21-day, 28-day, or 91-day packs. Some types of combination pills are meant to be taken continuously (365-day pills). With 21-day packs, you do not take pills for 7 days after the last pill. With 28-day packs, the pill is taken every day. The last 7 pills are without hormones. Certain types of pills have more than 21 hormone-containing pills. With 91-day packs, the  first 84 pills contain both hormones, and the last 7 pills contain no hormones or contain estrogen only.  The minipill-This pill contains the progesterone hormone only. The pill is taken every day continuously. It is very important to take the pill at the same time each day. The minipill comes in packs of 28 pills. All 28 pills contain the hormone. Advantages of oral contraceptive pills  Decreases premenstrual symptoms.  Treats menstrual period cramps.  Regulates the menstrual cycle.  Decreases a heavy menstrual flow.  May treatacne, depending on the type of pill.  Treats abnormal uterine bleeding.  Treats polycystic ovarian syndrome.  Treats endometriosis.  Can be used as emergency contraception. Things that can make oral contraceptive pills less effective OCPs can be less effective if:  You forget to take the pill at the same time every day.  You have a stomach or intestinal disease that lessens the absorption of the pill.  You take OCPs with other medicines that make OCPs less effective, such as antibiotics, certain HIV medicines, and some seizure medicines.  You take expired OCPs.  You forget to restart the pill on day 7, when using the packs of 21 pills.  Risks associated with oral contraceptive pills Oral contraceptive pills can sometimes cause side effects, such as:  Headache.  Nausea.  Breast tenderness.  Irregular bleeding or spotting.  Combination pills are also associated with a small increased risk of:  Blood clots.  Heart attack.  Stroke.  This information is not intended to replace advice given to you by your health care provider. Make sure you discuss any questions you have with your health care provider. Document Released: 03/24/2002 Document Revised: 06/09/2015 Document Reviewed: 06/22/2012 Elsevier Interactive Patient Education  [employer].

## 2017-01-22 NOTE — Progress Notes (Signed)
[place] Partum Exam  Jacqueline [name] is a 37 y.o. [phone] female who presents for a postpartum visit. She is 8 week postpartum following a VBAC.. I have fully reviewed the prenatal and intrapartum course. The delivery was at 28 gestational weeks.  Anesthesia: epidural. Postpartum course has been uneventful. Baby's course has been uneventful. Baby is feeding by bottle - Similac Alimentum. Bleeding moderate lochia. Bowel function is normal. Bladder function is normal. Patient is sexually active. Contraception method is OCP (estrogen/progesterone). Postpartum depression screening:negative- score:4  The following portions of the patient's history were reviewed and updated as appropriate: allergies, current medications, past family history, past medical history, past social history, past surgical history and problem list.  Baby had a hospital admission after birth for a skin staph infection but is better now  No longer breastfeeding  Patient has had nasal congestion and sore throat with cough for 2 days.  Sudafed not helping. Has not taken Mucinex.  No fever.   Review of Systems Pertinent items are noted in HPI.    Objective:  Last menstrual period 03/04/2016, not currently breastfeeding.  General:  alert, cooperative and no distress   Breasts:  inspection negative, no nipple discharge or bleeding, no masses or nodularity palpable  Lungs: clear to auscultation bilaterally  No wheezes  Heart:  regular rate and rhythm, S1, S2 normal, no murmur, click, rub or gallop  Abdomen: soft, non-tender; bowel sounds normal; no masses,  no organomegaly   Vulva:  not evaluated  Vagina: not evaluated  Cervix:  n/a  Corpus: not examined  Adnexa:  not evaluated  Rectal Exam: Not performed.        Assessment:    Normal postpartum exam. Pap smear not done at today's visit.  Upper Respiratory Infection with cough  Plan:   1. Contraception: OCP (estrogen/progesterone) 2. Rx Mucinex and Tussionex for  URI.  Continue Sudafed.  If persists past a week would give Zpack 3. Follow up in: 1 year or as needed.

## 2017-01-23 ENCOUNTER — Other Ambulatory Visit: Payer: Self-pay | Admitting: *Deleted

## 2017-01-23 MED ORDER — AZITHROMYCIN 250 MG PO TABS: ORAL_TABLET | ORAL | 0 refills | Status: DC

## 2017-01-23 NOTE — Progress Notes (Signed)
Pt called to office stating that her URI symptoms are no better. Pt states she has used Mucinex and Tussinex and has been able to sleep. Pt states that Lelan Pons advised her to call office if no better and we may prescribe her a Zpak. Per recent provider note, Zpak can be sent.  Zpak was sent to pt pharmacy today. Pt advised to contact office if no better with medication.

## 2017-02-17 ENCOUNTER — Other Ambulatory Visit: Payer: Self-pay | Admitting: Advanced Practice Midwife

## 2017-02-17 DIAGNOSIS — F411 Generalized anxiety disorder: Secondary | ICD-10-CM

## 2017-02-17 DIAGNOSIS — G4700 Insomnia, unspecified: Secondary | ICD-10-CM

## 2017-03-01 ENCOUNTER — Other Ambulatory Visit: Payer: Self-pay | Admitting: Family Medicine

## 2017-04-03 ENCOUNTER — Other Ambulatory Visit: Payer: Self-pay | Admitting: Advanced Practice Midwife

## 2017-04-03 DIAGNOSIS — G4700 Insomnia, unspecified: Secondary | ICD-10-CM

## 2017-04-03 DIAGNOSIS — F411 Generalized anxiety disorder: Secondary | ICD-10-CM

## 2017-04-05 NOTE — Telephone Encounter (Signed)
Will provide 1 refill. Needs to be managed by Behavioral Health going forward, not OB.

## 2017-05-29 ENCOUNTER — Other Ambulatory Visit: Payer: Self-pay | Admitting: Family Medicine

## 2017-06-14 ENCOUNTER — Ambulatory Visit (INDEPENDENT_AMBULATORY_CARE_PROVIDER_SITE_OTHER): Payer: BLUE CROSS/BLUE SHIELD | Admitting: Obstetrics & Gynecology

## 2017-06-14 ENCOUNTER — Encounter: Payer: Self-pay | Admitting: Obstetrics & Gynecology

## 2017-06-14 VITALS — BP 127/76 | HR 88 | Wt 178.0 lb

## 2017-06-14 DIAGNOSIS — Z01419 Encounter for gynecological examination (general) (routine) without abnormal findings: Secondary | ICD-10-CM

## 2017-06-14 DIAGNOSIS — Z3042 Encounter for surveillance of injectable contraceptive: Secondary | ICD-10-CM

## 2017-06-14 DIAGNOSIS — Z1151 Encounter for screening for human papillomavirus (HPV): Secondary | ICD-10-CM | POA: Diagnosis not present

## 2017-06-14 DIAGNOSIS — Z124 Encounter for screening for malignant neoplasm of cervix: Secondary | ICD-10-CM

## 2017-06-14 DIAGNOSIS — Z113 Encounter for screening for infections with a predominantly sexual mode of transmission: Secondary | ICD-10-CM | POA: Diagnosis not present

## 2017-06-14 DIAGNOSIS — Z3202 Encounter for pregnancy test, result negative: Secondary | ICD-10-CM

## 2017-06-14 DIAGNOSIS — B977 Papillomavirus as the cause of diseases classified elsewhere: Secondary | ICD-10-CM

## 2017-06-14 DIAGNOSIS — Z3009 Encounter for other general counseling and advice on contraception: Secondary | ICD-10-CM

## 2017-06-14 LAB — POCT URINE PREGNANCY: PREG TEST UR: NEGATIVE

## 2017-06-14 MED ORDER — MEDROXYPROGESTERONE ACETATE 150 MG/ML IM SUSP
150.0000 mg | Freq: Once | INTRAMUSCULAR | Status: AC
  Administered 2017-06-14: 150 mg via INTRAMUSCULAR

## 2017-06-14 NOTE — Progress Notes (Signed)
Subjective:     Jacqueline [name] is a 37 y.o. female here for a routine exam.  Current complaints: stopped Zoloft dueot night sweats. Reports no depression . Reports not taking the OCPs regularly.  Would like Depo Provera. Wants to conceive in the next 1-2 years.    Gynecologic History No LMP recorded. 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 Last mammogram: n/a.   Obstetric History OB History  Gravida Para Term Preterm AB Living  4 2 2   2 2   SAB TAB Ectopic Multiple Live Births  2     0 2    # Outcome Date GA Lbr Len/2nd Weight Sex Delivery Anes PTL Lv  4 Term 11/18/16 [redacted]w[redacted]d 07:35 / 03:01 6 lb 9.3 oz (2.985 kg) M VBAC EPI  LIV  3 Term 06/17/04     CS-LTranv     2 SAB           1 SAB             The following portions of the patient's history were reviewed and updated as appropriate: allergies, current medications, past family history, past medical history, past social history, past surgical history and problem list.  Review of Systems Pertinent items are noted in HPI.    Objective:  BP 127/76   Pulse 88   Wt 178 lb (80.7 kg)   BMI 30.55 kg/m   General Appearance:    Alert, cooperative, no distress, appears stated age  Head:    Normocephalic, without obvious abnormality, atraumatic  Eyes:    conjunctiva/corneas clear, EOM's intact, both eyes  Ears:    Normal external ear canals, both ears  Nose:   Nares normal, septum midline, mucosa normal, no drainage    or sinus tenderness  Throat:   Lips, mucosa, and tongue normal; teeth and gums normal  Neck:   Supple, symmetrical, trachea midline, no adenopathy;    thyroid:  no enlargement/tenderness/nodules  Back:     Symmetric, no curvature, ROM normal, no CVA tenderness  Lungs:     Clear to auscultation bilaterally, respirations unlabored  Chest Wall:    No tenderness or deformity   Heart:    Regular rate and rhythm, S1 and S2 normal, no murmur, rub   or  gallop  Breast Exam:    No tenderness, masses, or nipple abnormality  Abdomen:     Soft, non-tender, bowel sounds active all four quadrants,    no masses, no organomegaly  Genitalia:    Normal female without lesion, discharge or tenderness     Extremities:   Extremities normal, atraumatic, no cyanosis or edema  Pulses:   2+ and symmetric all extremities  Skin:   Skin color, texture, turgor normal, no rashes or lesions    Assessment:    Healthy female exam.   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 Follow up in: 1 year.  for annual and q 12 weeks for Depo Provera  Wilmot Quevedo L. Harraway-Smith, M.D., Cherlynn June

## 2017-06-14 NOTE — Progress Notes (Signed)
pPatient desire to switch to Depo Provera. Kathrene Alu RN

## 2017-06-18 LAB — CYTOLOGY - PAP
Chlamydia: NEGATIVE
DIAGNOSIS: NEGATIVE
HPV: NOT DETECTED
Neisseria Gonorrhea: NEGATIVE

## 2017-08-07 ENCOUNTER — Other Ambulatory Visit: Payer: Self-pay

## 2017-08-07 ENCOUNTER — Encounter: Payer: Self-pay | Admitting: Obstetrics & Gynecology

## 2017-08-07 ENCOUNTER — Ambulatory Visit (INDEPENDENT_AMBULATORY_CARE_PROVIDER_SITE_OTHER): Payer: BLUE CROSS/BLUE SHIELD | Admitting: Obstetrics & Gynecology

## 2017-08-07 VITALS — BP 134/81 | HR 88 | Temp 98.4°F | Ht 64.0 in | Wt 178.0 lb

## 2017-08-07 DIAGNOSIS — N898 Other specified noninflammatory disorders of vagina: Secondary | ICD-10-CM

## 2017-08-07 DIAGNOSIS — N9089 Other specified noninflammatory disorders of vulva and perineum: Secondary | ICD-10-CM | POA: Diagnosis not present

## 2017-08-07 MED ORDER — VALACYCLOVIR HCL 1 G PO TABS: 1000.0000 mg | ORAL_TABLET | Freq: Every day | ORAL | 2 refills | Status: DC

## 2017-08-07 NOTE — Patient Instructions (Signed)

## 2017-08-07 NOTE — Progress Notes (Signed)
History:  37 y.o. [id number] here today for eval of vulvar lesion. The lesion has been present for 3 days. It began as mild irritation and became much more severe. Now it is painful to wear clothes and to void. Pt denies h/o HSV lesions but, thinks she may have had a +blood test for HSV  in the past. Pt is also worried that she may have spread infxn to her partner as they were sexually active last evening. Pt denies new partners.  The following portions of the patient's history were reviewed and updated as appropriate: allergies, current medications, past family history, past medical history, past social history, past surgical history and problem list.  Review of Systems:  Pertinent items are noted in HPI.    Objective:  Physical Exam Blood pressure 134/81, pulse 88, temperature 98.4 F (36.9 C), temperature source Oral, height 5\' 4"  (1.626 m), weight 178 lb (80.7 kg), last menstrual period 07/30/2017, not currently breastfeeding.  CONSTITUTIONAL: Well-developed, well-nourished female in no acute distress.  HENT:  Normocephalic, atraumatic EYES: Conjunctivae and EOM are normal. No scleral icterus.  NECK: Normal range of motion SKIN: Skin is warm and dry. No rash noted. Not diaphoretic.No pallor. [place]: Alert and oriented to person, place, and time. Normal coordination.   Pelvic: there is a raised lesion with vesicals on the right labia majus; no other lesions are noted.     Assessment & Plan:  Painful vesicular lesion. Pt offered cx and observation vs pharmacologic tx. Pt opts fo medical management  Valtrex 1000mg  daily x 5 days  F/u HSV cx  Pt educated that if this lesion comes back neg it does not mean that it is not HSV as this  Lesion has been present 72 hours and the test may be inconclusive  All questions answered.   Reviewed care of lesion   Pt on Depo provera for contraception   F/u in 1 month for next injection  Begin Ca++ and Vit D  Kimiyo Carmicheal L. Ihor Dow, M.D.,  [place]   .

## 2017-08-07 NOTE — Progress Notes (Signed)
Patient reported vaginal bump x 1 week. Patient denies vaginal discharge and odor.

## 2017-08-10 LAB — HERPES SIMPLEX VIRUS CULTURE

## 2017-08-12 ENCOUNTER — Telehealth: Payer: Self-pay

## 2017-08-12 DIAGNOSIS — N9089 Other specified noninflammatory disorders of vulva and perineum: Secondary | ICD-10-CM

## 2017-08-12 NOTE — Telephone Encounter (Signed)
Patient called and verified name and dob. 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 Patient made aware to take the Valtrex that was prescribed for her for the five days.  Patient is to take Valtrex every time she has an outbreak. There are 2 refills on her current script for this.   Patient states understanding. Kathrene Alu RN

## 2017-09-02 ENCOUNTER — Other Ambulatory Visit (INDEPENDENT_AMBULATORY_CARE_PROVIDER_SITE_OTHER): Payer: BLUE CROSS/BLUE SHIELD

## 2017-09-02 VITALS — BP 143/94 | HR 83

## 2017-09-02 DIAGNOSIS — Z3042 Encounter for surveillance of injectable contraceptive: Secondary | ICD-10-CM

## 2017-09-02 DIAGNOSIS — R030 Elevated blood-pressure reading, without diagnosis of hypertension: Secondary | ICD-10-CM

## 2017-09-02 MED ORDER — MEDROXYPROGESTERONE ACETATE 150 MG/ML IM SUSP
150.0000 mg | INTRAMUSCULAR | Status: AC
  Administered 2017-09-02: 150 mg via INTRAMUSCULAR

## 2017-09-02 NOTE — Progress Notes (Addendum)
Patient complaining of headaches and worried about blood pressure.  Patient given information about internal medicine down the hallway and recommend she set up a new patient appointment with them. Kathrene Alu RN  Attestation of Attending Supervision of RN: Evaluation and management procedures were performed by the nurse under my supervision and collaboration.  I have reviewed the nursing note and chart, and I agree with the management and plan.  Carolyn L. Harraway-Smith, M.D., [place]nn June

## 2017-09-05 ENCOUNTER — Ambulatory Visit: Payer: BLUE CROSS/BLUE SHIELD | Admitting: Family Medicine

## 2017-09-05 DIAGNOSIS — Z0289 Encounter for other administrative examinations: Secondary | ICD-10-CM

## 2017-09-09 ENCOUNTER — Other Ambulatory Visit: Payer: Self-pay

## 2017-09-27 ENCOUNTER — Ambulatory Visit: Payer: BLUE CROSS/BLUE SHIELD | Admitting: Family Medicine

## 2017-09-27 ENCOUNTER — Encounter: Payer: Self-pay | Admitting: Family Medicine

## 2017-09-27 VITALS — BP 138/90 | HR 84 | Temp 98.2°F | Ht 64.0 in | Wt 190.5 lb

## 2017-09-27 DIAGNOSIS — R030 Elevated blood-pressure reading, without diagnosis of hypertension: Secondary | ICD-10-CM

## 2017-09-27 DIAGNOSIS — R5383 Other fatigue: Secondary | ICD-10-CM

## 2017-09-27 DIAGNOSIS — Z8632 Personal history of gestational diabetes: Secondary | ICD-10-CM

## 2017-09-27 MED ORDER — HYDROCHLOROTHIAZIDE 25 MG PO TABS: 25.0000 mg | ORAL_TABLET | Freq: Every day | ORAL | 3 refills | Status: DC

## 2017-09-27 NOTE — Progress Notes (Signed)
Chief Complaint  Patient presents with  . New Patient (Initial Visit)       New Patient Visit SUBJECTIVE: HPI: Jacqueline [name] is an 37 y.o.female who is being seen for establishing care.  The patient was previously seen at her OB's office.  Hx of high blood pressure. Took HCTZ over pregnancy. Has gained wt due to inactivity and poor dietary choices. +famhx of HTN and DM. Does not check BP at home routinely but it is always high when she has it checked at her women's health provider. Going to stop Depo shots because it makes her appetite increase. She is also interested in having children after the shot expires.   +hx of fatigue. Does not snore at night. Not active and diet poor as noted above. She did have gest dm during pregnancy, but allegedly improved after delivering.   Allergies  Allergen Reactions  . Penicillins Hives, Shortness Of Breath and Other (See Comments)    Has patient had a PCN reaction causing immediate rash, facial/tongue/throat swelling, SOB or lightheadedness with hypotension: Yes Has patient had a PCN reaction causing severe rash involving mucus membranes or skin necrosis: No Has patient had a PCN reaction that required hospitalization No Has patient had a PCN reaction occurring within the last 10 years: Yes If all of the above answers are "NO", then may proceed with Cephalosporin use.    Past Medical History:  Diagnosis Date  . Fibroid   . Gestational diabetes    Past Surgical History:  Procedure Laterality Date  . CESAREAN SECTION     Family History  Problem Relation Age of Onset  . Hyperlipidemia Mother   . Stroke Brother   . Hypertension Maternal Grandmother    Allergies  Allergen Reactions  . Penicillins Hives, Shortness Of Breath and Other (See Comments)    Has patient had a PCN reaction causing immediate rash, facial/tongue/throat swelling, SOB or lightheadedness with hypotension: Yes Has patient had a PCN reaction causing severe rash  involving mucus membranes or skin necrosis: No Has patient had a PCN reaction that required hospitalization No Has patient had a PCN reaction occurring within the last 10 years: Yes If all of the above answers are "NO", then may proceed with Cephalosporin use.    Current Outpatient Medications:  .  cetirizine (ZYRTEC) 10 MG tablet, Take 10 mg by mouth daily., Disp: , Rfl:  .  fluticasone (FLONASE) 50 MCG/ACT nasal spray, Place 1 spray into both nostrils daily. , Disp: , Rfl:  .  valACYclovir (VALTREX) 1000 MG tablet, Take 1 tablet (1,000 mg total) by mouth daily. Take for 5 days, Disp: 5 tablet, Rfl: 2 .  hydrochlorothiazide (HYDRODIURIL) 25 MG tablet, Take 1 tablet (25 mg total) by mouth daily., Disp: 30 tablet, Rfl: 3  Current Facility-Administered Medications:  .  medroxyPROGESTERone (DEPO-PROVERA) injection 150 mg, 150 mg, Intramuscular, Q90 days, Lavonia Drafts, MD, 150 mg at 09/02/17 1008  ROS Cardiovascular: Denies chest pain  Respiratory: Denies dyspnea   OBJECTIVE: BP 138/90 (BP Location: Left Arm, Patient Position: Sitting, Cuff Size: Normal)   Pulse 84   Temp 98.2 F (36.8 C) (Oral)   Ht 5\' 4"  (1.626 m)   Wt 190 lb 8 oz (86.4 kg)   SpO2 99%   BMI 32.70 kg/m   Constitutional: -  VS reviewed -  Well developed, well nourished, appears stated age -  No apparent distress  Psychiatric: -  Oriented to person, place, and time -  Memory intact -  Affect and mood normal -  Fluent conversation, good eye contact -  Judgment and insight age appropriate  Eye: -  Conjunctivae clear, no discharge -  Pupils symmetric, round, reactive to light  ENMT: -  MMM    Pharynx moist, no exudate, no erythema  Neck: -  No gross swelling, no palpable masses -  Thyroid midline, not enlarged, mobile, no palpable masses  Cardiovascular: -  RRR -  No bruits -  No LE edema  Respiratory: -  Normal respiratory effort, no accessory muscle use, no retraction -  Breath sounds equal, no  wheezes, no ronchi, no crackles  Neurological:  -  CN II - XII grossly intact -  DTR's equal and symmetric -  Sensation grossly intact to light touch, equal bilaterally  Musculoskeletal: -  No clubbing, no cyanosis -  Gait normal -   5/5 strength throughout  Skin: -  No significant lesion on inspection -  Warm and dry to palpation   ASSESSMENT/PLAN: Elevated blood pressure reading - Plan: hydrochlorothiazide (HYDRODIURIL) 25 MG tablet, Basic metabolic panel  Fatigue, unspecified type - Plan: CBC, Comprehensive metabolic panel, TSH, Vitamin D (25 hydroxy)  History of gestational diabetes - Plan: Hemoglobin A1c  Patient instructed to sign release of records form from her previous PCP. Counseled on diet and exercise. Healthy diet handout given. Ck labs.  +hx of anxiety, does not wish to go on medication. Had AE's with Zoloft. Patient should return in 6 weeks to recheck BP. The patient voiced understanding and agreement to the plan.   [place], DO 09/27/17  3:32 PM

## 2017-09-27 NOTE — Patient Instructions (Signed)
Aim to do some physical exertion for 150 minutes per week. This is typically divided into 5 days per week, 30 minutes per day. The activity should be enough to get your heart rate up. Anything is better than nothing if you have time constraints.  Healthy Eating Plan Many factors influence your heart health, including eating and exercise habits. Heart (coronary) risk increases with abnormal blood fat (lipid) levels. Heart-healthy meal planning includes limiting unhealthy fats, increasing healthy fats, and making other small dietary changes. This includes maintaining a healthy body weight to help keep lipid levels within a normal range.  WHAT IS MY PLAN?  Your health care provider recommends that you:  Drink a glass of water before meals to help with satiety.  Eat slowly.  An alternative to the water is to add Metamucil. This will help with satiety as well. It does contain calories, unlike water.  WHAT TYPES OF FAT SHOULD I CHOOSE?  Choose healthy fats more often. Choose monounsaturated and polyunsaturated fats, such as olive oil and canola oil, flaxseeds, walnuts, almonds, and seeds.  Eat more omega-3 fats. Good choices include salmon, mackerel, sardines, tuna, flaxseed oil, and ground flaxseeds. Aim to eat fish at least two times each week.  Avoid foods with partially hydrogenated oils in them. These contain trans fats. Examples of foods that contain trans fats are stick margarine, some tub margarines, cookies, crackers, and other baked goods. If you are going to avoid a fat, this is the one to avoid!  WHAT GENERAL GUIDELINES DO I NEED TO FOLLOW?  Check food labels carefully to identify foods with trans fats. Avoid these types of options when possible.  Fill one half of your plate with vegetables and green salads. Eat 4-5 servings of vegetables per day. A serving of vegetables equals 1 cup of raw leafy vegetables,  cup of raw or cooked cut-up vegetables, or  cup of vegetable  juice.  Fill one fourth of your plate with whole grains. Look for the word "whole" as the first word in the ingredient list.  Fill one fourth of your plate with lean protein foods.  Eat 4-5 servings of fruit per day. A serving of fruit equals one medium whole fruit,  cup of dried fruit,  cup of fresh, frozen, or canned fruit. Try to avoid fruits in cups/syrups as the sugar content can be high.  Eat more foods that contain soluble fiber. Examples of foods that contain this type of fiber are apples, broccoli, carrots, beans, peas, and barley. Aim to get 20-30 g of fiber per day.  Eat more home-cooked food and less restaurant, buffet, and fast food.  Limit or avoid alcohol.  Limit foods that are high in starch and sugar.  Avoid fried foods when able.  Cook foods by using methods other than frying. Baking, boiling, grilling, and broiling are all great options. Other fat-reducing suggestions include: ? Removing the skin from poultry. ? Removing all visible fats from meats. ? Skimming the fat off of stews, soups, and gravies before serving them. ? Steaming vegetables in water or broth.  Lose weight if you are overweight. Losing just 5-10% of your initial body weight can help your overall health and prevent diseases such as diabetes and heart disease.  Increase your consumption of nuts, legumes, and seeds to 4-5 servings per week. One serving of dried beans or legumes equals  cup after being cooked, one serving of nuts equals 1 ounces, and one serving of seeds equals  ounce or   1 tablespoon.  WHAT ARE GOOD FOODS CAN I EAT? Grains Grainy breads (try to find bread that is 3 g of fiber per slice or greater), oatmeal, light popcorn. Whole-grain cereals. Rice and pasta, including brown rice and those that are made with whole wheat. Edamame pasta is a great alternative to grain pasta. It has a higher protein content. Try to avoid significant consumption of white bread, sugary cereals, or  pastries/baked goods.  Vegetables All vegetables. Cooked white potatoes do not count as vegetables.  Fruits All fruits, but limit pineapple and bananas as these fruits have a higher sugar content.  Meats and Other Protein Sources Lean, well-trimmed beef, veal, pork, and lamb. Chicken and turkey without skin. All fish and shellfish. Wild duck, rabbit, pheasant, and venison. Egg whites or low-cholesterol egg substitutes. Dried beans, peas, lentils, and tofu.Seeds and most nuts.  Dairy Low-fat or nonfat cheeses, including ricotta, string, and mozzarella. Skim or 1% milk that is liquid, powdered, or evaporated. Buttermilk that is made with low-fat milk. Nonfat or low-fat yogurt. Soy/Almond milk are good alternatives if you cannot handle dairy.  Beverages Water is the best for you. Sports drinks with less sugar are more desirable unless you are a highly active athlete.  Sweets and Desserts Sherbets and fruit ices. Honey, jam, marmalade, jelly, and syrups. Dark chocolate.  Eat all sweets and desserts in moderation.  Fats and Oils Nonhydrogenated (trans-free) margarines. Vegetable oils, including soybean, sesame, sunflower, olive, peanut, safflower, corn, canola, and cottonseed. Salad dressings or mayonnaise that are made with a vegetable oil. Limit added fats and oils that you use for cooking, baking, salads, and as spreads.  Other Cocoa powder. Coffee and tea. Most condiments.  The items listed above may not be a complete list of recommended foods or beverages. Contact your dietitian for more options.   

## 2017-09-27 NOTE — Progress Notes (Signed)
Pre visit review using our clinic review tool, if applicable. No additional management support is needed unless otherwise documented below in the visit note. 

## 2017-09-28 ENCOUNTER — Other Ambulatory Visit: Payer: Self-pay | Admitting: Family Medicine

## 2017-09-28 ENCOUNTER — Encounter: Payer: Self-pay | Admitting: Family Medicine

## 2017-09-28 DIAGNOSIS — R7303 Prediabetes: Secondary | ICD-10-CM | POA: Insufficient documentation

## 2017-09-28 DIAGNOSIS — E559 Vitamin D deficiency, unspecified: Secondary | ICD-10-CM | POA: Insufficient documentation

## 2017-09-28 LAB — COMPREHENSIVE METABOLIC PANEL
AG RATIO: 1.6 (calc) (ref 1.0–2.5)
ALBUMIN MSPROF: 4.4 g/dL (ref 3.6–5.1)
ALT: 18 U/L (ref 6–29)
AST: 14 U/L (ref 10–30)
Alkaline phosphatase (APISO): 81 U/L (ref 33–115)
BUN: 10 mg/dL (ref 7–25)
CHLORIDE: 104 mmol/L (ref 98–110)
CO2: 18 mmol/L — AB (ref 20–32)
Calcium: 10 mg/dL (ref 8.6–10.2)
Creat: 0.71 mg/dL (ref 0.50–1.10)
GLOBULIN: 2.7 g/dL (ref 1.9–3.7)
GLUCOSE: 94 mg/dL (ref 65–99)
POTASSIUM: 4.6 mmol/L (ref 3.5–5.3)
SODIUM: 140 mmol/L (ref 135–146)
TOTAL PROTEIN: 7.1 g/dL (ref 6.1–8.1)
Total Bilirubin: 0.5 mg/dL (ref 0.2–1.2)

## 2017-09-28 LAB — HEMOGLOBIN A1C
EAG (MMOL/L): 7 (calc)
Hgb A1c MFr Bld: 6 % of total Hgb — ABNORMAL HIGH (ref <5.7)
Mean Plasma Glucose: 126 (calc)

## 2017-09-28 LAB — CBC
HCT: 36.4 % (ref 35.0–45.0)
HEMOGLOBIN: 12.3 g/dL (ref 11.7–15.5)
MCH: 29.4 pg (ref 27.0–33.0)
MCHC: 33.8 g/dL (ref 32.0–36.0)
MCV: 87.1 fL (ref 80.0–100.0)
MPV: 10.6 fL (ref 7.5–12.5)
Platelets: 243 10*3/uL (ref 140–400)
RBC: 4.18 10*6/uL (ref 3.80–5.10)
RDW: 12.9 % (ref 11.0–15.0)
WBC: 6.9 10*3/uL (ref 3.8–10.8)

## 2017-09-28 LAB — VITAMIN D 25 HYDROXY (VIT D DEFICIENCY, FRACTURES): Vit D, 25-Hydroxy: 19 ng/mL — ABNORMAL LOW (ref 30–100)

## 2017-09-28 LAB — TSH: TSH: 1.44 mIU/L

## 2017-09-28 MED ORDER — VITAMIN D (ERGOCALCIFEROL) 1.25 MG (50000 UNIT) PO CAPS: 50000.0000 [IU] | ORAL_CAPSULE | ORAL | 0 refills | Status: DC

## 2017-09-30 ENCOUNTER — Other Ambulatory Visit: Payer: Self-pay | Admitting: Family Medicine

## 2017-09-30 DIAGNOSIS — E559 Vitamin D deficiency, unspecified: Secondary | ICD-10-CM

## 2017-10-08 ENCOUNTER — Other Ambulatory Visit: Payer: BLUE CROSS/BLUE SHIELD

## 2017-10-25 ENCOUNTER — Ambulatory Visit: Payer: BLUE CROSS/BLUE SHIELD | Admitting: Family Medicine

## 2017-10-25 ENCOUNTER — Encounter: Payer: Self-pay | Admitting: Family Medicine

## 2017-10-25 VITALS — BP 122/80 | HR 77 | Temp 98.8°F | Ht 64.0 in | Wt 191.4 lb

## 2017-10-25 DIAGNOSIS — J029 Acute pharyngitis, unspecified: Secondary | ICD-10-CM

## 2017-10-25 LAB — POCT RAPID STREP A (OFFICE): RAPID STREP A SCREEN: NEGATIVE

## 2017-10-25 MED ORDER — METHYLPREDNISOLONE ACETATE 80 MG/ML IJ SUSP
80.0000 mg | Freq: Once | INTRAMUSCULAR | Status: AC
  Administered 2017-10-25: 80 mg via INTRAMUSCULAR

## 2017-10-25 MED ORDER — AZITHROMYCIN 250 MG PO TABS: ORAL_TABLET | ORAL | 0 refills | Status: DC

## 2017-10-25 NOTE — Addendum Note (Signed)
Addended by: Sharon Seller B on: 10/25/2017 10:06 AM   Modules accepted: Orders

## 2017-10-25 NOTE — Patient Instructions (Addendum)
Throw out toothbrush after 24 hours of antibiotics.  Continue to push fluids, practice good hand hygiene, and cover your mouth if you cough.  If you start having fevers, shaking or shortness of breath, seek immediate care.  We will be in touch regarding your culture results.  Let us know if you need anything.

## 2017-10-25 NOTE — Progress Notes (Signed)
SUBJECTIVE:   Jacqueline [name] is a 37 y.o. female presents to the clinic for:  Chief Complaint  Patient presents with  . Sore Throat    Complains of sore throat for 2 d. Other associated symptoms: sore throat and myalgia.  Denies: sinus congestion, sinus pain, rhinorrhea, itchy watery eyes, ear pain, ear drainage, wheezing, shortness of breath and cough and fever Sick Contacts: none known Therapy to date: Theraflu  Social History   Tobacco Use  Smoking Status Never Smoker  Smokeless Tobacco Never Used    ROS: Pertinent items are noted in HPI  Patient's medications, allergies, past medical, surgical, social and family histories were reviewed and updated as appropriate.  OBJECTIVE:  BP 122/80 (BP Location: Left Arm, Patient Position: Sitting, Cuff Size: Normal)   Pulse 77   Temp 98.8 F (37.1 C) (Oral)   Ht 5\' 4"  (1.626 m)   Wt 191 lb 6 oz (86.8 kg)   SpO2 97%   BMI 32.85 kg/m  General: Awake, alert, appearing stated age Eyes: conjunctivae and sclerae clear Ears: normal TMs bilaterally Nose: no visible exudate Oropharynx: pharnyx mildly erythematous, exudates noted b/l, no asymmetry Neck: supple, no significant adenopathy Lungs: clear to auscultation, no wheezes, rales or rhonchi, symmetric air entry, normal effort Heart: rate and rhythm regular Skin:reveals no rash Psych: Age appropriate judgment and insight  ASSESSMENT/PLAN:  Sore throat - Plan: azithromycin (ZITHROMAX) 250 MG tablet  Orders as above. Macrolide given PCN allery. Continue to practice good hand hygiene and push fluids. Ibuprofen and acetaminophen for pain. Replace toothbrush after 24 hours of being on abx. F/u prn. Pt voiced understanding and agreement to the plan.  [place], DO 10/25/17 9:43 AM

## 2017-10-25 NOTE — Progress Notes (Signed)
Pre visit review using our clinic review tool, if applicable. No additional management support is needed unless otherwise documented below in the visit note. 

## 2017-10-27 LAB — CULTURE, GROUP A STREP
MICRO NUMBER:: 91225508
SPECIMEN QUALITY: ADEQUATE

## 2017-11-12 ENCOUNTER — Ambulatory Visit: Payer: BLUE CROSS/BLUE SHIELD

## 2017-11-12 IMAGING — US US MFM OB DETAIL+14 WK
1 series · 14 of 28 positions shown · non-contrast
Comparison: none

[Series 1: us mfm ob detail+14 wk · 101 acquisitions, 14 frames shown]
[im 4/101]
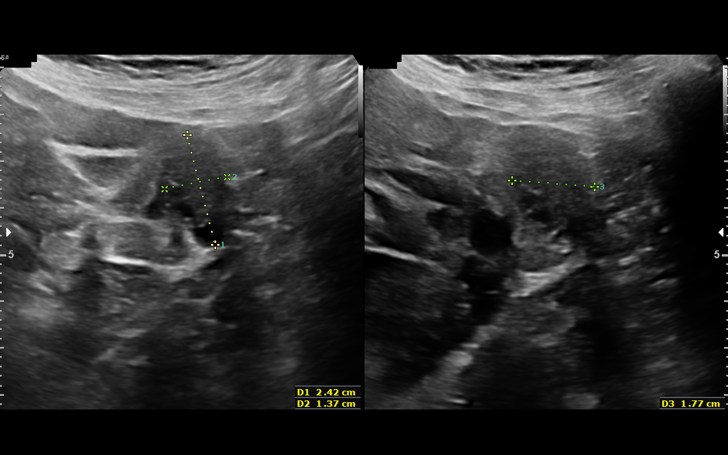
[im 12/101]
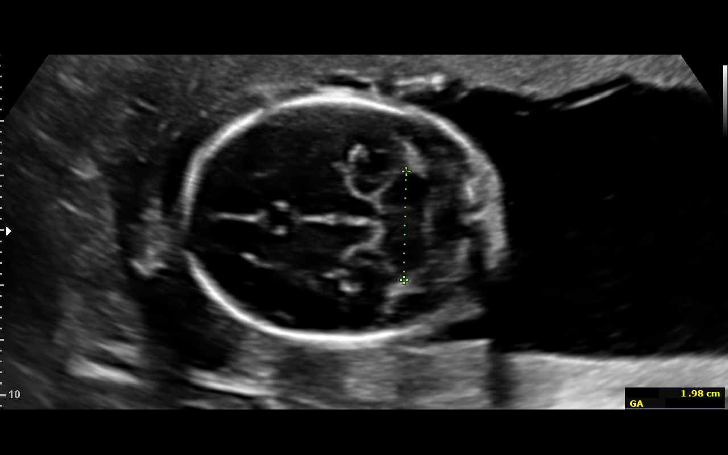
[im 19/101]
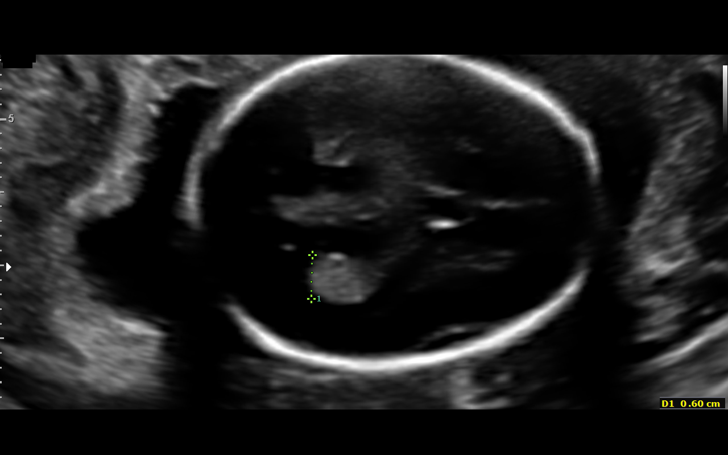
[im 26/101]
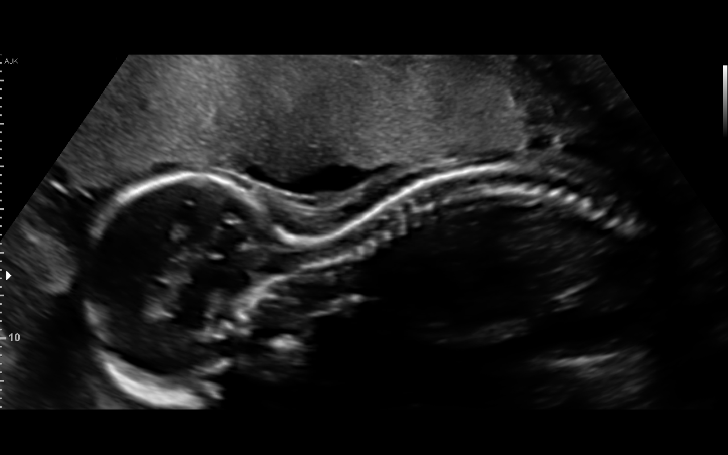
[im 34/101]
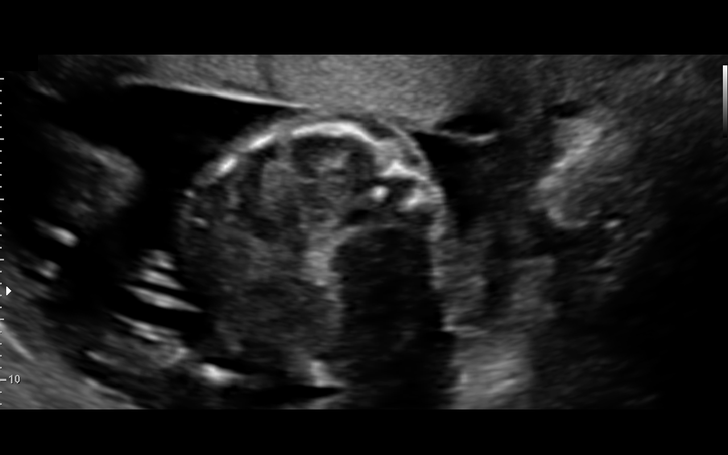
[im 41/101]
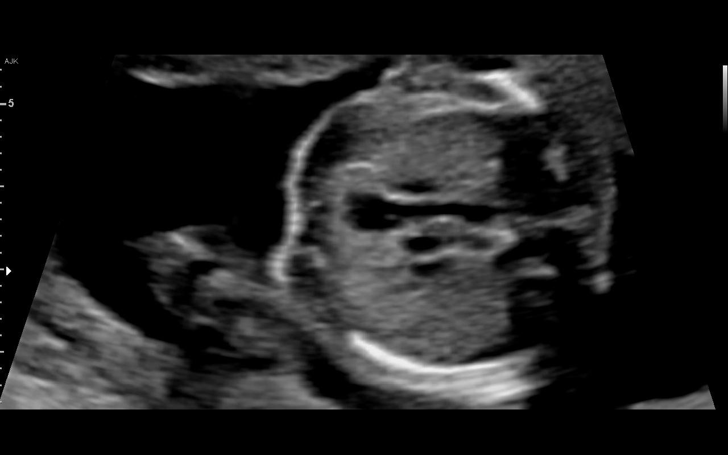
[im 49/101]
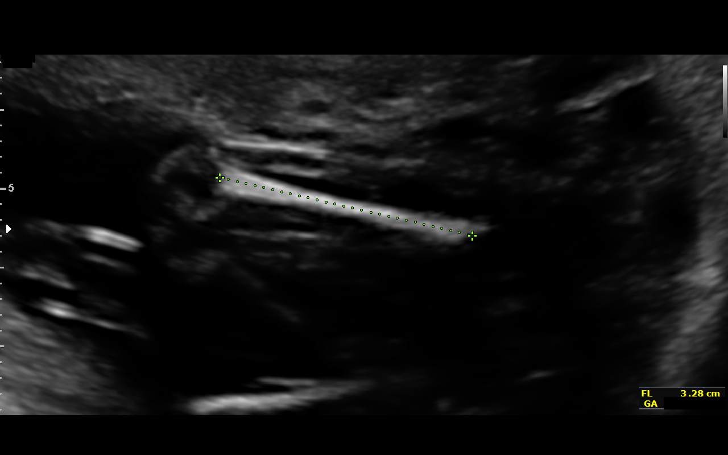
[im 56/101]
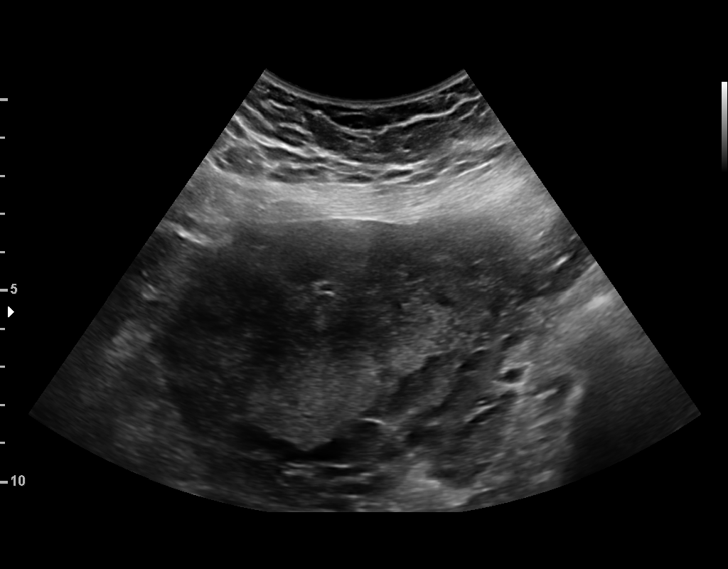
[im 63/101]
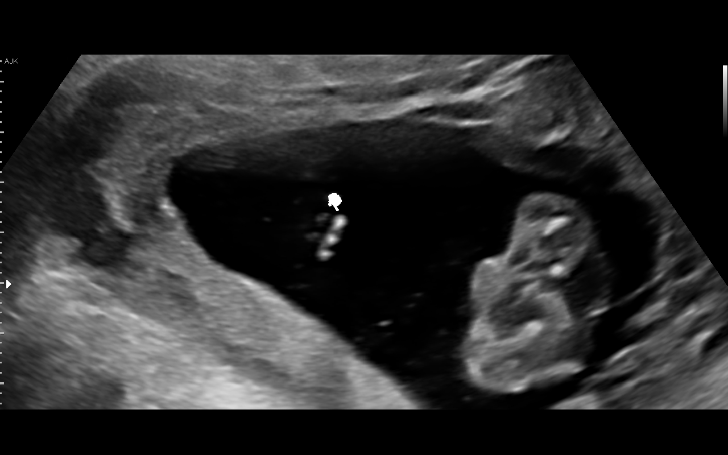
[im 71/101]
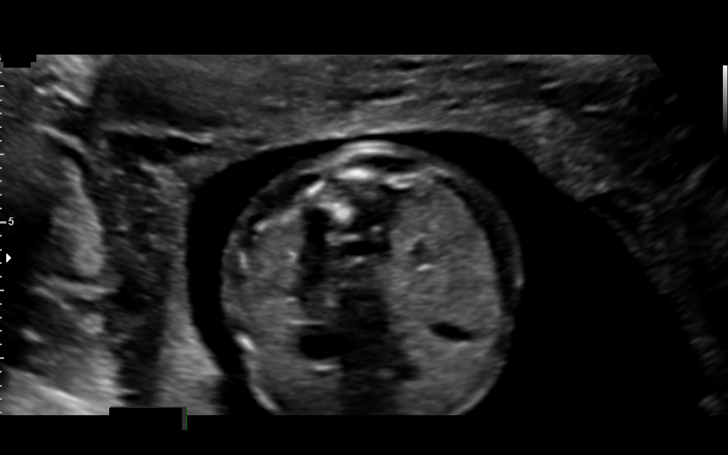
[im 78/101]
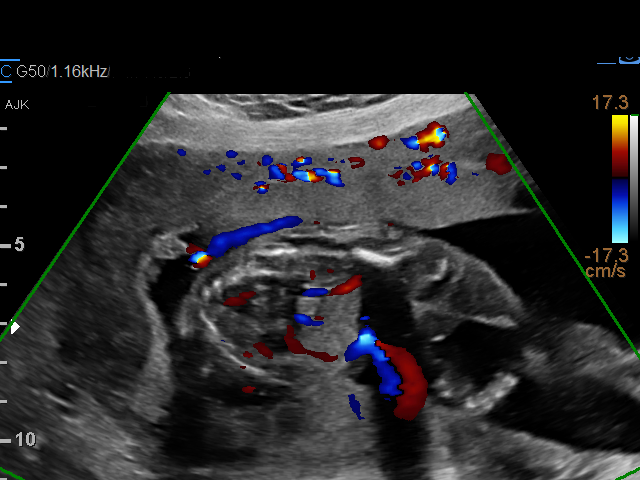
[im 86/101]
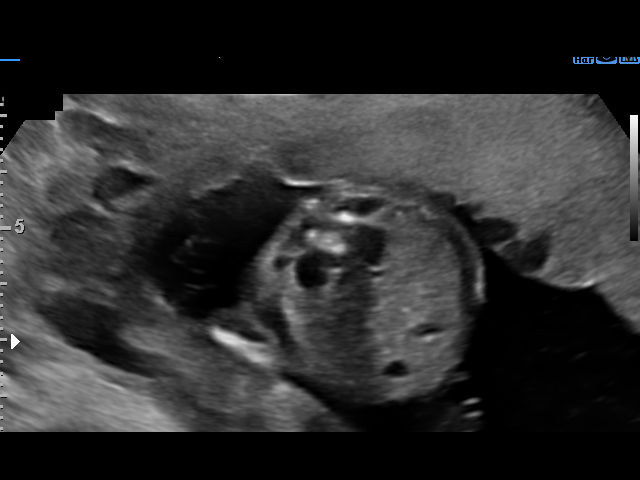
[im 93/101]
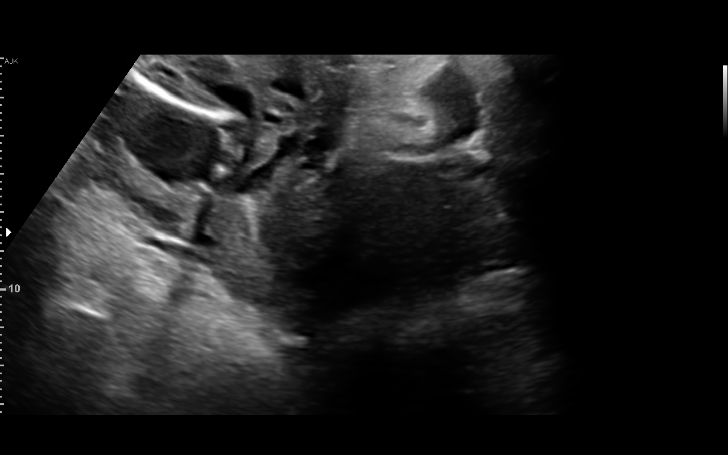
[im 101/101]
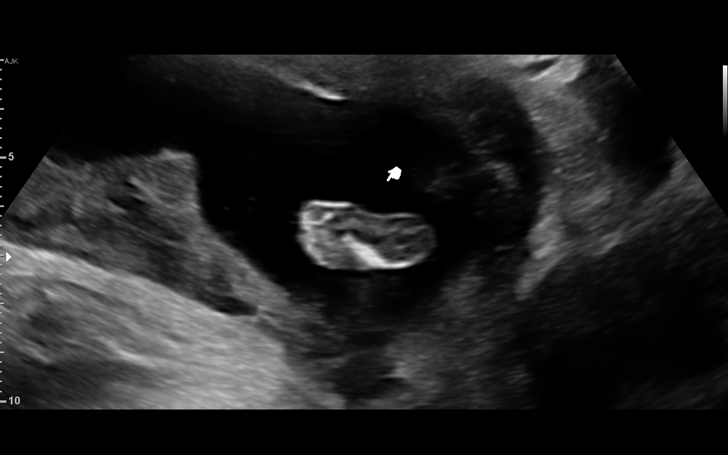

[14 of 28 positions shown; findings below may reference images not displayed]

MASKATUN

VENUS

GIORGI
Indications

19 weeks gestation of pregnancy
Previous cesarean delivery, antepartum
Advanced maternal age multigravida 35+,
second trimester
Encounter for fetal anatomic survey
Obesity complicating pregnancy, second
trimester
OB History

Blood Type:            Height:  5'4"   Weight (lb):  177      BMI:
Gravidity:    4         Term:   1         SAB:   2
Living:       1
Fetal Evaluation

Num Of Fetuses:     1
Fetal Heart         154
Rate(bpm):
Cardiac Activity:   Observed
Presentation:       Breech
Placenta:           Anterior, above cervical os
P. Cord Insertion:  Marginal insertion

Amniotic Fluid
AFI FV:      Subjectively within normal limits

Largest Pocket(cm)
4.15
Biometry

BPD:      44.4  mm     G. Age:  19w 3d         63  %    CI:        72.09   %   70 - 86
FL/HC:      19.2   %   16.1 -
HC:      166.4  mm     G. Age:  19w 2d         52  %    HC/AC:      1.17       1.09 -
AC:      141.9  mm     G. Age:  19w 4d         59  %    FL/BPD:     71.8   %
FL:       31.9  mm     G. Age:  20w 0d         71  %    FL/AC:      22.5   %   20 - 24
CER:      19.8  mm     G. Age:  18w 6d         45  %
NFT:       5.3  mm

CM:        4.9  mm

Est. FW:     307  gm    0 lb 11 oz      54  %
Gestational Age

LMP:           19w 1d       Date:   03/04/16                 EDD:   12/09/16
U/S Today:     19w 4d                                        EDD:   12/06/16
Best:          19w 1d    Det. By:   LMP  (03/04/16)          EDD:   12/09/16
Anatomy

Cranium:               Appears normal         Aortic Arch:            Appears normal
Cavum:                 Appears normal         Ductal Arch:            Not well visualized
Ventricles:            Appears normal         Diaphragm:              Appears normal
Choroid Plexus:        Appears normal         Stomach:                Appears normal, left
sided
Cerebellum:            Appears normal         Abdomen:                Appears normal
Posterior Fossa:       Appears normal         Abdominal Wall:         Appears nml (cord
insert, abd wall)
Nuchal Fold:           Appears normal         Cord Vessels:           Appears normal (3
vessel cord)
Face:                  Orbits nl; profile not Kidneys:                Appear normal
well visualized
Lips:                  Appears normal         Bladder:                Appears normal
Thoracic:              Appears normal         Spine:                  Appears normal
Heart:                 Appears normal         Upper Extremities:      Appears normal
(4CH, axis, and
situs)
RVOT:                  Appears normal         Lower Extremities:      Appears normal
LVOT:                  Appears normal

Other:  Fetus appears to be a male. RT 5th digit visualized. Technically
difficult due to maternal habitus and fetal position.
Cervix Uterus Adnexa

Cervix
Length:           3.35  cm.
Normal appearance by transabdominal scan.

Uterus
Multiple fibroids noted, see table below.

Left Ovary
Within normal limits.

Right Ovary
Within normal limits.

Adnexa:       No abnormality visualized.
Myomas

Site                     L(cm)      W(cm)      D(cm)      Location
Anterior
LUS

Blood Flow                 RI        PI       Comments

Impression

Singleton intrauterine pregnancy at 19+1 weeks with AMA,
here for anatomic survey
Review of the anatomy shows no sonographic markers for
aneuploidy or structural anomalies
However, cardiac and profile evaluations should be
considered suboptimal secondary to maternal habitus and
fetal position
Amniotic fluid volume is normal
Estimated fetal weight is 307g which is growth in the 54th
percentile
Recommendations

Repeat scan in 4 weeks to complete anatomic survey
Patient is unsure if she had aneuploidy screening, would
address that at next clinic visit

## 2017-11-20 ENCOUNTER — Other Ambulatory Visit: Payer: Self-pay

## 2017-12-08 ENCOUNTER — Other Ambulatory Visit: Payer: Self-pay | Admitting: Advanced Practice Midwife

## 2017-12-18 ENCOUNTER — Telehealth: Payer: Self-pay

## 2017-12-18 ENCOUNTER — Other Ambulatory Visit: Payer: Self-pay

## 2017-12-18 MED ORDER — MEDROXYPROGESTERONE ACETATE 150 MG/ML IM SUSP: 150.0000 mg | INTRAMUSCULAR | 3 refills | Status: DC

## 2017-12-18 MED ORDER — VALACYCLOVIR HCL 1 G PO TABS: 1000.0000 mg | ORAL_TABLET | Freq: Two times a day (BID) | ORAL | 2 refills | Status: AC

## 2017-12-18 NOTE — Telephone Encounter (Signed)
Patient returned call to clinic and need a refill on valtrex NOT depo provera. Called pharmacy and cancelled script for Depo Provera and sent new rx for valtrex. Kathrene Alu RN

## 2017-12-18 NOTE — Telephone Encounter (Signed)
Patient called needing refill of Depo Provera called into pharmacy so she can get her next injection. Kathrene Alu RN

## 2017-12-22 ENCOUNTER — Other Ambulatory Visit: Payer: Self-pay | Admitting: Family Medicine

## 2017-12-23 ENCOUNTER — Other Ambulatory Visit: Payer: Self-pay | Admitting: Family Medicine

## 2017-12-23 DIAGNOSIS — R030 Elevated blood-pressure reading, without diagnosis of hypertension: Secondary | ICD-10-CM

## 2017-12-23 MED ORDER — HYDROCHLOROTHIAZIDE 25 MG PO TABS: 25.0000 mg | ORAL_TABLET | Freq: Every day | ORAL | 3 refills | Status: DC

## 2017-12-31 ENCOUNTER — Other Ambulatory Visit: Payer: BLUE CROSS/BLUE SHIELD

## 2018-03-31 ENCOUNTER — Other Ambulatory Visit: Payer: Self-pay | Admitting: Family Medicine

## 2018-04-10 ENCOUNTER — Other Ambulatory Visit: Payer: Self-pay

## 2018-04-11 ENCOUNTER — Other Ambulatory Visit: Payer: Self-pay

## 2018-04-11 MED ORDER — VALACYCLOVIR HCL 1 G PO TABS: 1000.0000 mg | ORAL_TABLET | Freq: Every day | ORAL | 0 refills | Status: DC

## 2018-05-26 ENCOUNTER — Other Ambulatory Visit: Payer: Self-pay

## 2018-05-26 DIAGNOSIS — B009 Herpesviral infection, unspecified: Secondary | ICD-10-CM

## 2018-05-26 MED ORDER — VALACYCLOVIR HCL 1 G PO TABS: 1000.0000 mg | ORAL_TABLET | Freq: Every day | ORAL | 0 refills | Status: DC

## 2018-05-26 NOTE — Progress Notes (Signed)
Pt called the office requesting a refill of Valtrex 1000 mg. Medication was sent to pharmacy.  chiquita l wilson, CMA

## 2018-07-09 ENCOUNTER — Other Ambulatory Visit: Payer: Self-pay

## 2018-07-09 DIAGNOSIS — B009 Herpesviral infection, unspecified: Secondary | ICD-10-CM

## 2018-07-09 MED ORDER — VALACYCLOVIR HCL 1 G PO TABS: 1000.0000 mg | ORAL_TABLET | Freq: Every day | ORAL | 0 refills | Status: DC

## 2018-07-25 ENCOUNTER — Other Ambulatory Visit: Payer: Self-pay

## 2018-07-25 ENCOUNTER — Encounter: Payer: Self-pay | Admitting: Obstetrics & Gynecology

## 2018-07-25 ENCOUNTER — Ambulatory Visit (INDEPENDENT_AMBULATORY_CARE_PROVIDER_SITE_OTHER): Payer: BLUE CROSS/BLUE SHIELD | Admitting: Obstetrics & Gynecology

## 2018-07-25 VITALS — BP 129/70 | HR 78 | Ht 64.0 in | Wt 188.0 lb

## 2018-07-25 DIAGNOSIS — N898 Other specified noninflammatory disorders of vagina: Secondary | ICD-10-CM | POA: Diagnosis not present

## 2018-07-25 DIAGNOSIS — Z01419 Encounter for gynecological examination (general) (routine) without abnormal findings: Secondary | ICD-10-CM

## 2018-07-25 DIAGNOSIS — A6000 Herpesviral infection of urogenital system, unspecified: Secondary | ICD-10-CM

## 2018-07-25 DIAGNOSIS — Z113 Encounter for screening for infections with a predominantly sexual mode of transmission: Secondary | ICD-10-CM

## 2018-07-25 MED ORDER — VALACYCLOVIR HCL 500 MG PO TABS: 500.0000 mg | ORAL_TABLET | Freq: Every day | ORAL | 12 refills | Status: DC

## 2018-07-25 NOTE — Progress Notes (Signed)
Subjective:     Jacqueline [name] is a 38 y.o. female here for a routine exam.  Current complaints: pt reports recurrent sx of pain at site of prev outbreak of HSV. Wants to start suppression. Pt is not currently sexually active. Pt is not currently on contraception.       Gynecologic History No LMP recorded. Contraception: abstinence Last Pap: 06/14/2017. Results were: normal. Neg hrHPV Last mammogram: n/a  Obstetric History OB History  Gravida Para Term Preterm AB Living  4 2 2   2 2   SAB TAB Ectopic Multiple Live Births  2     0 2    # Outcome Date GA Lbr Len/2nd Weight Sex Delivery Anes PTL Lv  4 Term 11/18/16 [redacted]w[redacted]d 07:35 / 03:01 6 lb 9.3 oz (2.985 kg) M VBAC EPI  LIV  3 Term 06/17/04     CS-LTranv     2 SAB           1 SAB            The following portions of the patient's history were reviewed and updated as appropriate: allergies, current medications, past family history, past medical history, past social history, past surgical history and problem list.  Review of Systems Pertinent items are noted in HPI.    Objective:  BP 129/70   Pulse 78   Ht 5\' 4"  (1.626 m)   Wt 188 lb (85.3 kg)   LMP 07/01/2018   BMI 32.27 kg/m   General Appearance:    Alert, cooperative, no distress, appears stated age  Head:    Normocephalic, without obvious abnormality, atraumatic  Eyes:    conjunctiva/corneas clear, EOM's intact, both eyes  Ears:    Normal external ear canals, both ears  Nose:   Nares normal, septum midline, mucosa normal, no drainage    or sinus tenderness  Throat:   Lips, mucosa, and tongue normal; teeth and gums normal  Neck:   Supple, symmetrical, trachea midline, no adenopathy;    thyroid:  no enlargement/tenderness/nodules  Back:     Symmetric, no curvature, ROM normal, no CVA tenderness  Lungs:     respirations unlabored  Chest Wall:    No tenderness or deformity   Heart:    Regular rate and rhythm  Breast Exam:    No tenderness, masses, or nipple  abnormality  Abdomen:     Soft, non-tender, bowel sounds active all four quadrants,    no masses, no organomegaly  Genitalia:    Normal female without lesion, discharge or tenderness     Extremities:   Extremities normal, atraumatic, no cyanosis or edema  Pulses:   2+ and symmetric all extremities  Skin:   Skin color, texture, turgor normal, no rashes or lesions    Assessment:    Healthy female exam.   STI screen HSV recurrent- pt desires suppression    Plan:   Jacqueline [name] was seen today for gynecologic exam.  Diagnoses and all orders for this visit:  Well female exam with routine gynecological exam -     Cervicovaginal ancillary only( B and E)  Routine screening for STI (sexually transmitted infection) -     HIV antibody (with reflex) -     RPR -     Hepatitis B surface antigen  Recurrent genital HSV (herpes simplex virus) infection -     valACYclovir (VALTREX) 500 MG tablet; Take 1 tablet (500 mg total) by mouth daily. Can increase to twice a day for 5 days  in the event of a recurrence  f/u in 1 year or sooner prn   Elvenia Godden L. Harraway-Smith, M.D., Cherlynn June

## 2018-07-26 LAB — RPR: RPR Ser Ql: NONREACTIVE

## 2018-07-26 LAB — HIV ANTIBODY (ROUTINE TESTING W REFLEX): HIV Screen 4th Generation wRfx: NONREACTIVE

## 2018-07-26 LAB — HEPATITIS B SURFACE ANTIGEN: Hepatitis B Surface Ag: NEGATIVE

## 2018-07-30 LAB — CERVICOVAGINAL ANCILLARY ONLY
Bacterial vaginitis: NEGATIVE
Candida vaginitis: NEGATIVE
Chlamydia: NEGATIVE
Neisseria Gonorrhea: NEGATIVE
Trichomonas: NEGATIVE

## 2018-08-26 ENCOUNTER — Telehealth: Payer: Self-pay | Admitting: *Deleted

## 2018-08-26 NOTE — Telephone Encounter (Signed)
[facility] sent over request for vitamin d.   Patient is past due for recheck.  She must call office to schedule vitamin d level before refilling.    Left message on machine for patient to call back to schedule.

## 2018-11-16 ENCOUNTER — Other Ambulatory Visit: Payer: Self-pay | Admitting: Family Medicine

## 2018-11-16 DIAGNOSIS — R030 Elevated blood-pressure reading, without diagnosis of hypertension: Secondary | ICD-10-CM

## 2019-07-03 ENCOUNTER — Encounter: Payer: Self-pay | Admitting: *Deleted

## 2019-07-31 ENCOUNTER — Other Ambulatory Visit: Payer: Self-pay

## 2019-07-31 DIAGNOSIS — A6000 Herpesviral infection of urogenital system, unspecified: Secondary | ICD-10-CM

## 2019-07-31 MED ORDER — VALACYCLOVIR HCL 500 MG PO TABS: 500.0000 mg | ORAL_TABLET | Freq: Every day | ORAL | 12 refills | Status: DC

## 2019-07-31 NOTE — Progress Notes (Signed)
Patient requested refill. Kathrene Alu RN

## 2020-08-10 ENCOUNTER — Other Ambulatory Visit: Payer: Self-pay | Admitting: Family Medicine

## 2020-08-10 ENCOUNTER — Other Ambulatory Visit: Payer: Self-pay

## 2020-08-10 ENCOUNTER — Ambulatory Visit (INDEPENDENT_AMBULATORY_CARE_PROVIDER_SITE_OTHER): Payer: 59 | Admitting: Family Medicine

## 2020-08-10 ENCOUNTER — Encounter: Payer: Self-pay | Admitting: Family Medicine

## 2020-08-10 VITALS — BP 122/84 | HR 79 | Temp 97.9°F | Ht 65.0 in | Wt 145.2 lb

## 2020-08-10 DIAGNOSIS — B3731 Acute candidiasis of vulva and vagina: Secondary | ICD-10-CM

## 2020-08-10 DIAGNOSIS — B373 Candidiasis of vulva and vagina: Secondary | ICD-10-CM | POA: Diagnosis not present

## 2020-08-10 DIAGNOSIS — E559 Vitamin D deficiency, unspecified: Secondary | ICD-10-CM | POA: Diagnosis not present

## 2020-08-10 DIAGNOSIS — R739 Hyperglycemia, unspecified: Secondary | ICD-10-CM

## 2020-08-10 LAB — LIPID PANEL
Cholesterol: 219 mg/dL — ABNORMAL HIGH (ref 0–200)
HDL: 47.3 mg/dL (ref >39.00)
LDL Cholesterol: 151 mg/dL — ABNORMAL HIGH (ref 0–99)
NonHDL: 171.24
Total CHOL/HDL Ratio: 5
Triglycerides: 101 mg/dL (ref 0.0–149.0)
VLDL: 20.2 mg/dL (ref 0.0–40.0)

## 2020-08-10 LAB — MICROALBUMIN / CREATININE URINE RATIO
Creatinine,U: 36.1 mg/dL
Microalb Creat Ratio: 1.9 mg/g (ref 0.0–30.0)
Microalb, Ur: <0.7 mg/dL (ref 0.0–1.9)

## 2020-08-10 LAB — VITAMIN D 25 HYDROXY (VIT D DEFICIENCY, FRACTURES): VITD: 35.31 ng/mL (ref 30.00–100.00)

## 2020-08-10 LAB — HEMOGLOBIN A1C: Hgb A1c MFr Bld: 13.4 % — ABNORMAL HIGH (ref 4.6–6.5)

## 2020-08-10 MED ORDER — METFORMIN HCL 500 MG PO TABS: ORAL_TABLET | ORAL | 1 refills | Status: AC

## 2020-08-10 MED ORDER — FLUCONAZOLE 150 MG PO TABS: ORAL_TABLET | ORAL | 0 refills | Status: DC

## 2020-08-10 NOTE — Patient Instructions (Signed)
Give [place] 2-3 business days to get the results of your labs back. If diabetic, I will be sending in a medicine.  Keep the diet clean and stay active.  Let us know if you need anything.

## 2020-08-10 NOTE — Progress Notes (Signed)
Chief Complaint  Patient presents with   wants to have blood sugar checked today    Subjective: Patient is a 40 y.o. female here for desire to have blood sugar checked.  Pt was in ED 1 week ago and random sugar was >200. She has been urinating more freq. She has intentionally lost weight over the last couple years. Urinary s/s's present for past mo roughly. +famhx of DM in M[facility], none in siblings or parents. She had gestational DM, but was only prediabetic on follow up testing post-partum. Eats healthy diet now, active at work. Does not monitor sugars at home. She also developed a yeast infection yesterday.   Past Medical History:  Diagnosis Date   Fibroid    Gestational diabetes    Prediabetes    Vitamin D deficiency     Objective: BP 122/84   Pulse 79   Temp 97.9 F (36.6 C) (Oral)   Ht '5\' 5"'$  (1.651 m)   Wt 145 lb 4 oz (65.9 kg)   SpO2 99%   BMI 24.17 kg/m  General: Awake, appears stated age HEENT: MMM Heart: RRR, no LE edema Lungs: CTAB, no rales, wheezes or rhonchi. No accessory muscle use Psych: Age appropriate judgment and insight, normal affect and mood  Assessment and Plan: Hyperglycemia - Plan: Hemoglobin A1c, Lipid panel, Microalbumin / creatinine urine ratio  Yeast vaginitis - Plan: fluconazole (DIFLUCAN) 150 MG tablet  Vitamin D deficiency - Plan: VITAMIN D 25 Hydroxy (Vit-D Deficiency, Fractures)  New, uncertain prog. Ck A1c. Likely has DM. Will ck urine micro/cr. She has moved to [place] so she will continue this workup with PCP there.  Counseled on diet/exercise. If DM, will start metformin given her s/s's.  Diflucan for yeast infection. The patient voiced understanding and agreement to the plan.  [place], DO 08/10/20  9:45 AM

## 2020-08-13 ENCOUNTER — Other Ambulatory Visit: Payer: Self-pay

## 2020-08-13 DIAGNOSIS — B373 Candidiasis of vulva and vagina: Secondary | ICD-10-CM

## 2020-08-13 DIAGNOSIS — B3731 Acute candidiasis of vulva and vagina: Secondary | ICD-10-CM

## 2020-08-15 ENCOUNTER — Telehealth: Payer: Self-pay

## 2020-08-15 MED ORDER — FLUCONAZOLE 150 MG PO TABS: ORAL_TABLET | ORAL | 0 refills | Status: AC

## 2020-08-15 NOTE — Telephone Encounter (Signed)
Patient informed rx sent in. 

## 2020-08-15 NOTE — Telephone Encounter (Signed)
Have already sent in another rx. Ty.

## 2020-08-15 NOTE — Telephone Encounter (Signed)
Pt called in states her symptoms are not getting any better from last week. Pt states that she still has the yeast.

## 2021-04-19 ENCOUNTER — Ambulatory Visit: Payer: 59 | Admitting: Obstetrics & Gynecology

## 2021-04-24 ENCOUNTER — Encounter: Payer: Self-pay | Admitting: Obstetrics and Gynecology

## 2021-04-24 ENCOUNTER — Other Ambulatory Visit (HOSPITAL_COMMUNITY)
Admission: RE | Admit: 2021-04-24 | Discharge: 2021-04-24 | Disposition: A | Discharge status: Home / Self Care | Payer: 59 | Source: Ambulatory Visit | Attending: Obstetrics and Gynecology | Admitting: Obstetrics and Gynecology

## 2021-04-24 ENCOUNTER — Ambulatory Visit (HOSPITAL_BASED_OUTPATIENT_CLINIC_OR_DEPARTMENT_OTHER)
Admission: RE | Admit: 2021-04-24 | Discharge: 2021-04-24 | Disposition: A | Discharge status: Home / Self Care | Payer: 59 | Source: Ambulatory Visit | Attending: Obstetrics and Gynecology | Admitting: Obstetrics and Gynecology

## 2021-04-24 ENCOUNTER — Ambulatory Visit (INDEPENDENT_AMBULATORY_CARE_PROVIDER_SITE_OTHER): Payer: 59 | Admitting: Obstetrics and Gynecology

## 2021-04-24 VITALS — BP 110/74 | HR 79 | Ht 64.0 in | Wt 144.0 lb

## 2021-04-24 DIAGNOSIS — Z01419 Encounter for gynecological examination (general) (routine) without abnormal findings: Secondary | ICD-10-CM

## 2021-04-24 DIAGNOSIS — D259 Leiomyoma of uterus, unspecified: Secondary | ICD-10-CM | POA: Diagnosis present

## 2021-04-24 NOTE — Progress Notes (Signed)
Subjective:  ?  ? Jacqueline [name] is a 41 y.o. female P2 with LMP 04/12/21 and BMI 24 who is here for a comprehensive physical exam. The patient reports no problems. She reports a monthly 4-5 day period with 2 heavy days. Patient reports a history of fibroid uterus diagnosed several years ago in [place]. Patient is sexually active using condoms. She is considering another pregnancy. Patient denies pelvic pain or abnormal discharge. Patient is without any other complaints ? ?Past Medical History:  ?Diagnosis Date  ? Fibroid   ? Gestational diabetes   ? Prediabetes   ? Vitamin D deficiency   ? ?Past Surgical History:  ?Procedure Laterality Date  ? CESAREAN SECTION    ? ?Family History  ?Problem Relation Age of Onset  ? Hypertension Maternal Grandmother   ? Diabetes Mellitus II Maternal Grandmother   ? Glaucoma Maternal Grandmother   ? Hyperlipidemia Mother   ? Hypertension Mother   ? Anemia Mother   ? Stroke Brother   ?     had issues with clotting  ? ? ?Social History  ? ?Socioeconomic History  ? Marital status: Single  ?  Spouse name: Not on file  ? Number of children: Not on file  ? Years of education: Not on file  ? Highest education level: Not on file  ?Occupational History  ? Not on file  ?Tobacco Use  ? Smoking status: Never  ? Smokeless tobacco: Never  ?Vaping Use  ? Vaping Use: Never used  ?Substance and Sexual Activity  ? Alcohol use: No  ? Drug use: No  ? Sexual activity: Yes  ?  Birth control/protection: Condom  ?Other Topics Concern  ? Not on file  ?Social History Narrative  ? Not on file  ? ?Social Determinants of Health  ? ?Financial Resource Strain: Not on file  ?Food Insecurity: Not on file  ?Transportation Needs: Not on file  ?Physical Activity: Not on file  ?Stress: Not on file  ?Social Connections: Not on file  ?Intimate Partner Violence: Not on file  ? ?Health Maintenance  ?Topic Date Due  ? Hepatitis C Screening  Never done  ? COVID-19 Vaccine (3 - Booster for Moderna series) 08/26/2019   ? PAP SMEAR-Modifier  06/14/2020  ? INFLUENZA VACCINE  08/15/2021  ? TETANUS/TDAP  09/20/2026  ? HIV Screening  Completed  ? HPV VACCINES  Aged Out  ? ? ?  ? ?Review of Systems ?Pertinent items noted in HPI and remainder of comprehensive ROS otherwise negative.  ? ?Objective:  ?Blood pressure 110/74, pulse 79, height '5\' 4"'$  (1.626 m), weight 144 lb (65.3 kg), last menstrual period 04/12/2021. ? ? GENERAL: Well-developed, well-nourished female in no acute distress.  ?HEENT: Normocephalic, atraumatic. Sclerae anicteric.  ?NECK: Supple. Normal thyroid.  ?LUNGS: Clear to auscultation bilaterally.  ?HEART: Regular rate and rhythm. ?BREASTS: Symmetric in size. No palpable masses or lymphadenopathy, skin changes, or nipple drainage. ?ABDOMEN: Soft, nontender, nondistended. No organomegaly. ?PELVIC: Normal external female genitalia. Vagina is pink and rugated.  Normal discharge. Normal appearing cervix. Uterus is normal in size. No adnexal mass or tenderness. Chaperone present during the pelvic exam ?EXTREMITIES: No cyanosis, clubbing, or edema, 2+ distal pulses. ?  ?  ?Assessment:  ? ? Healthy female exam.    ?  ?Plan:  ? ? Pap smear collected ?Health maintenance labs ordered ?Pelvic ultrasound ordered  ?Patient will be contacted with abnormal results ?Discussed taking prenatal vitamins ?Discussed optimizing diabetes prior to conception ?Follow up with PCP  as scheduled ?Screening mammogram next year ?See After Visit Summary for Counseling Recommendations  ? ?

## 2021-04-24 NOTE — Progress Notes (Signed)
Annual ?Wants RX vitamin D once weekly ?Wants fibroids "measured" again ?

## 2021-04-25 LAB — CYTOLOGY - PAP
Comment: NEGATIVE
Diagnosis: NEGATIVE
High risk HPV: NEGATIVE

## 2021-04-29 LAB — HEMOGLOBIN A1C
Est. average glucose Bld gHb Est-mCnc: 203 mg/dL
Hgb A1c MFr Bld: 8.7 % — ABNORMAL HIGH (ref 4.8–5.6)

## 2021-04-29 LAB — VITAMIN D 1,25 DIHYDROXY
Vitamin D 1, 25 (OH)2 Total: 43 pg/mL
Vitamin D2 1, 25 (OH)2: <10 pg/mL
Vitamin D3 1, 25 (OH)2: 43 pg/mL

## 2021-04-29 LAB — LIPID PANEL
Chol/HDL Ratio: 2.7 ratio (ref 0.0–4.4)
Cholesterol, Total: 140 mg/dL (ref 100–199)
HDL: 52 mg/dL (ref >39)
LDL Chol Calc (NIH): 76 mg/dL (ref 0–99)
Triglycerides: 58 mg/dL (ref 0–149)
VLDL Cholesterol Cal: 12 mg/dL (ref 5–40)

## 2021-04-29 LAB — COMPREHENSIVE METABOLIC PANEL
ALT: 20 IU/L (ref 0–32)
AST: 19 IU/L (ref 0–40)
Albumin/Globulin Ratio: 1.9 (ref 1.2–2.2)
Albumin: 4.5 g/dL (ref 3.8–4.8)
Alkaline Phosphatase: 73 IU/L (ref 44–121)
BUN/Creatinine Ratio: 17 (ref 9–23)
BUN: 9 mg/dL (ref 6–20)
Bilirubin Total: 0.6 mg/dL (ref 0.0–1.2)
CO2: 22 mmol/L (ref 20–29)
Calcium: 9.5 mg/dL (ref 8.7–10.2)
Chloride: 102 mmol/L (ref 96–106)
Creatinine, Ser: 0.53 mg/dL — ABNORMAL LOW (ref 0.57–1.00)
Globulin, Total: 2.4 g/dL (ref 1.5–4.5)
Glucose: 167 mg/dL — ABNORMAL HIGH (ref 70–99)
Potassium: 4.3 mmol/L (ref 3.5–5.2)
Sodium: 136 mmol/L (ref 134–144)
Total Protein: 6.9 g/dL (ref 6.0–8.5)
eGFR: 121 mL/min/{1.73_m2} (ref >59)

## 2021-04-29 LAB — CBC
Hematocrit: 33.8 % — ABNORMAL LOW (ref 34.0–46.6)
Hemoglobin: 11 g/dL — ABNORMAL LOW (ref 11.1–15.9)
MCH: 27.2 pg (ref 26.6–33.0)
MCHC: 32.5 g/dL (ref 31.5–35.7)
MCV: 84 fL (ref 79–97)
Platelets: 283 10*3/uL (ref 150–450)
RBC: 4.04 x10E6/uL (ref 3.77–5.28)
RDW: 14.5 % (ref 11.7–15.4)
WBC: 4.7 10*3/uL (ref 3.4–10.8)

## 2021-04-29 LAB — TSH: TSH: 1.62 u[IU]/mL (ref 0.450–4.500)

## 2021-05-04 ENCOUNTER — Other Ambulatory Visit (HOSPITAL_BASED_OUTPATIENT_CLINIC_OR_DEPARTMENT_OTHER): Payer: 59

## 2021-07-24 ENCOUNTER — Other Ambulatory Visit: Payer: Self-pay

## 2021-07-24 DIAGNOSIS — B009 Herpesviral infection, unspecified: Secondary | ICD-10-CM

## 2021-07-24 MED ORDER — VALACYCLOVIR HCL 500 MG PO TABS: ORAL_TABLET | ORAL | 11 refills | Status: DC

## 2021-07-24 NOTE — Progress Notes (Signed)
Patient called requesting a refill of Valtrex. Valtrex 500 mg BID x 3 days was sent to her pharmacy.  Roberta Angell l Maninder Deboer, CMA

## 2021-11-09 ENCOUNTER — Telehealth: Payer: Self-pay

## 2021-11-09 DIAGNOSIS — B009 Herpesviral infection, unspecified: Secondary | ICD-10-CM

## 2021-11-09 MED ORDER — VALACYCLOVIR HCL 500 MG PO TABS: ORAL_TABLET | ORAL | 5 refills | Status: AC

## 2021-11-09 NOTE — Telephone Encounter (Signed)
Patient has relocated to [place] and needs refill on Valtrex. Advise we can give it today but she will need to establish care there for future refills. Kathrene Alu RN

## 2022-08-21 IMAGING — US US PELVIS COMPLETE WITH TRANSVAGINAL
1 series · 12 of 25 positions shown · non-contrast
Comparison: None.
COMPARISON: None.

Addendum:
CLINICAL DATA: Menorrhagia, uterine fibroids

EXAM:
TRANSABDOMINAL AND TRANSVAGINAL ULTRASOUND OF PELVIS
DOPPLER ULTRASOUND OF OVARIES
TECHNIQUE: Both transabdominal and transvaginal ultrasound examinations of the
pelvis were performed. Transabdominal technique was performed for
global imaging of the pelvis including uterus, ovaries, adnexal
regions, and pelvic cul-de-sac.
It was necessary to proceed with endovaginal exam following the
transabdominal exam to visualize the endometrium and ovaries. Color
and duplex Doppler ultrasound was utilized to evaluate blood flow to
the ovaries.

[Series 1: us pelvis complete with transvaginal · 12 of 114 slices shown]
[im 5/114]
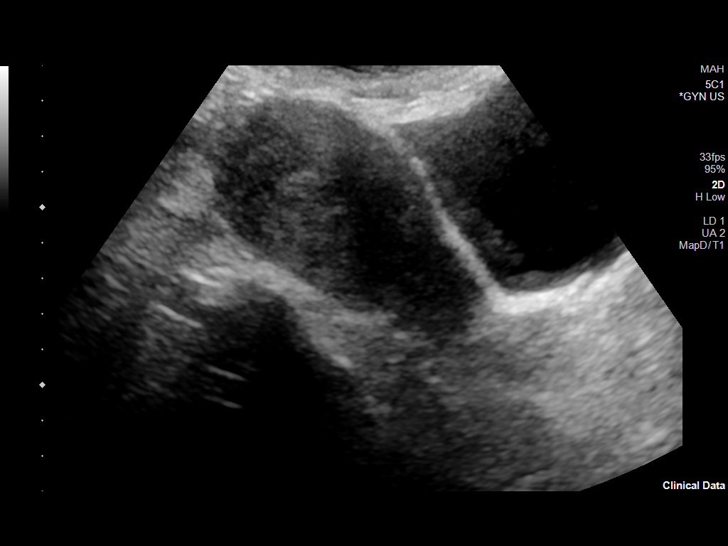
[im 15/114]
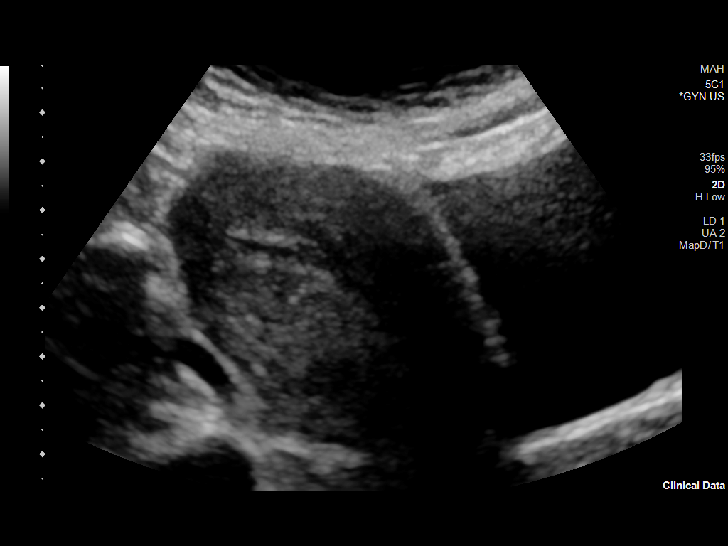
[im 24/114]
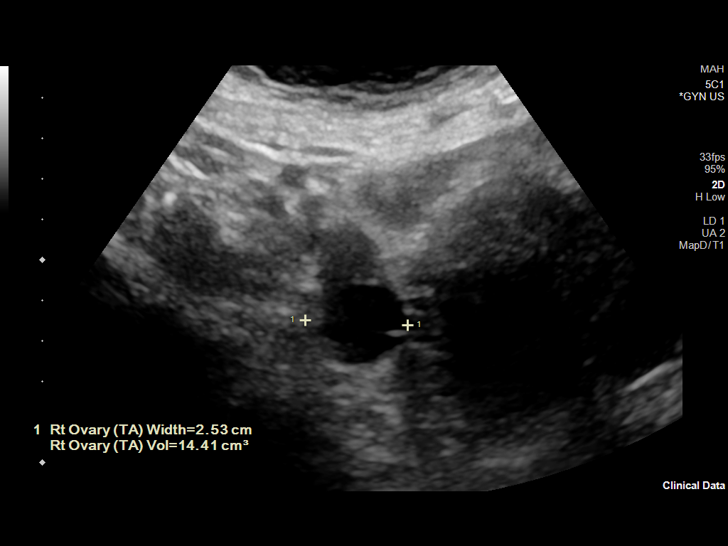
[im 33/114]
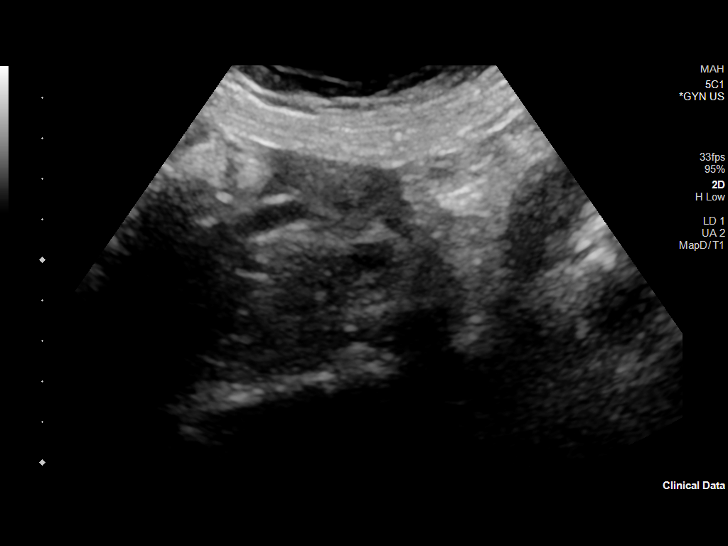
[im 43/114]
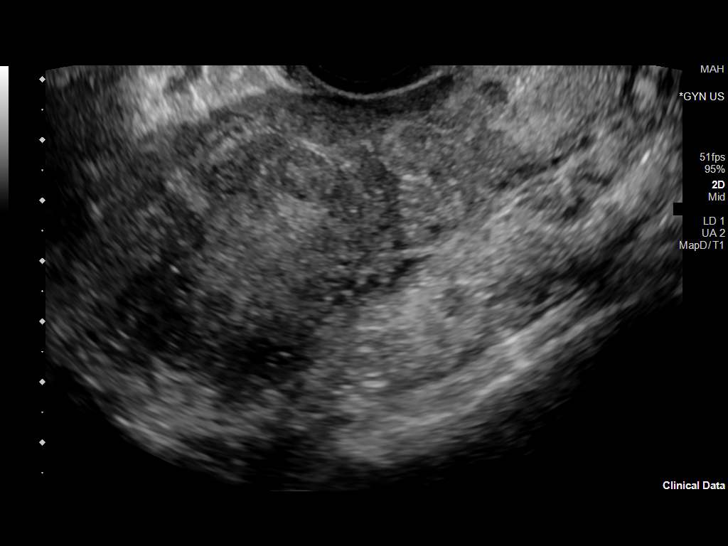
[im 52/114]
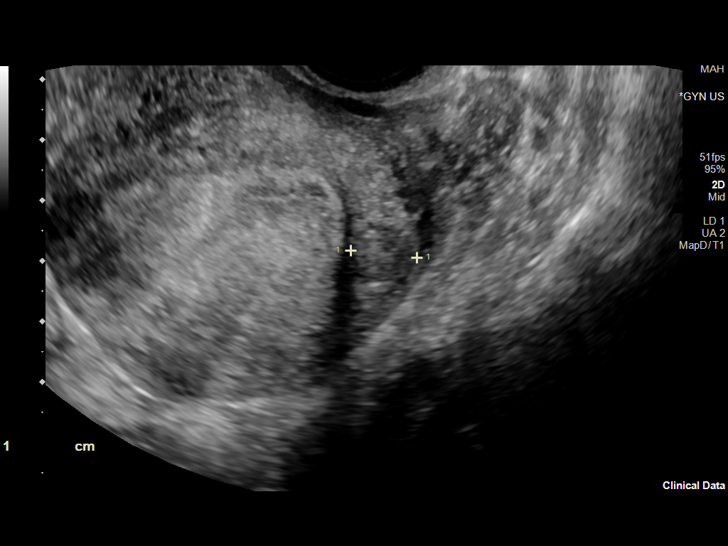
[im 62/114]
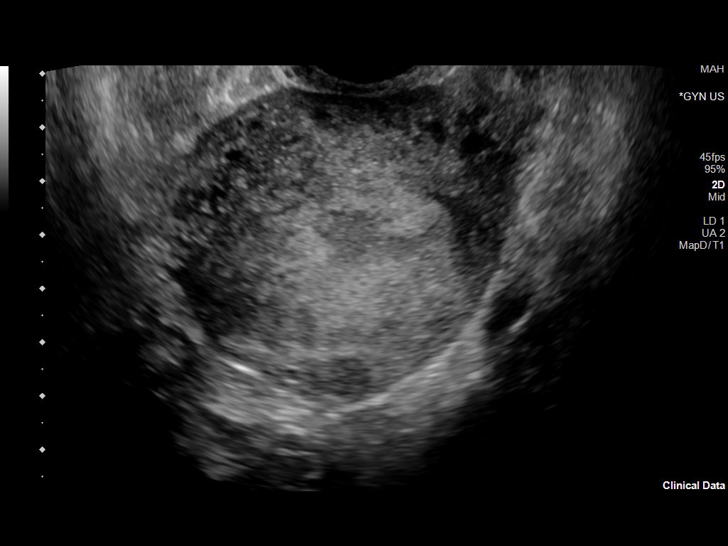
[im 71/114]
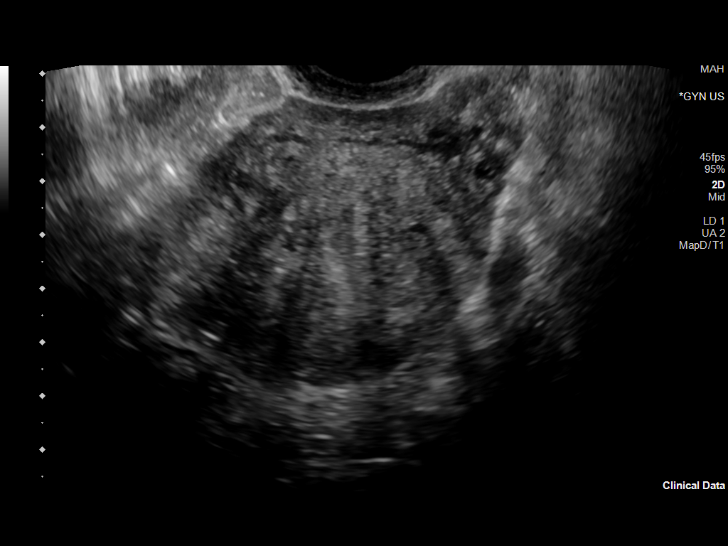
[im 81/114]
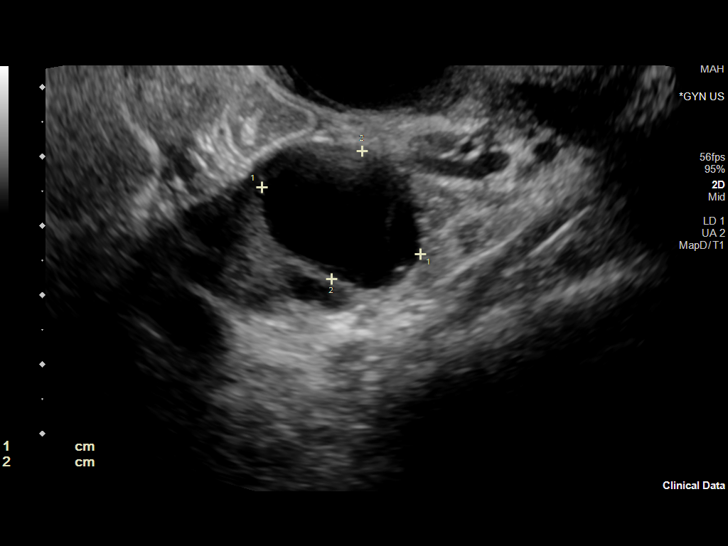
[im 90/114]
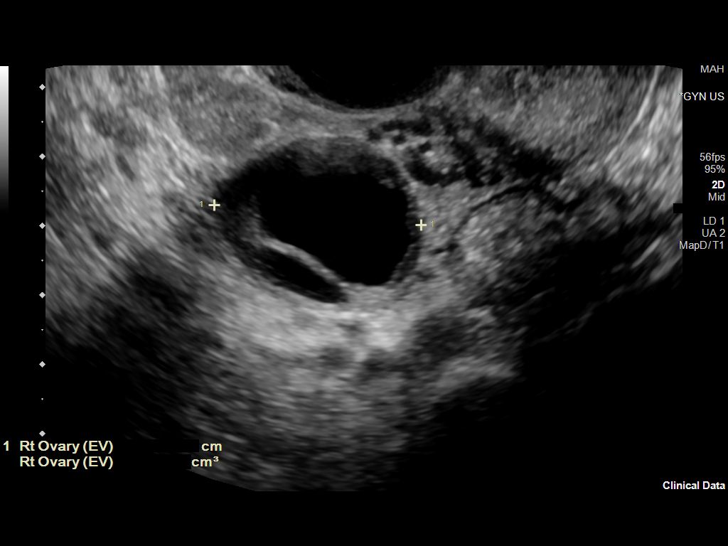
[im 99/114]
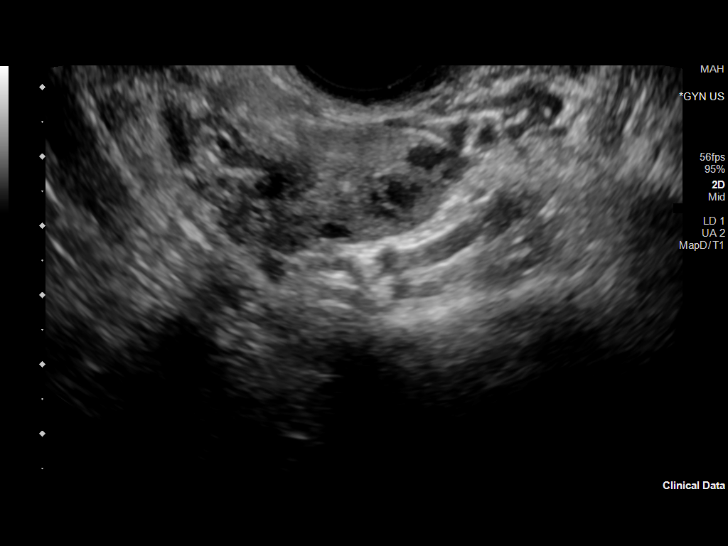
[im 109/114]
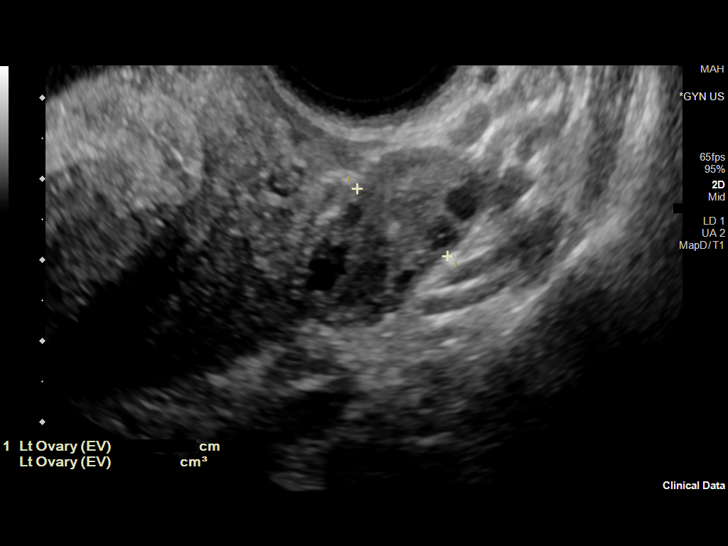

[12 of 25 positions shown; findings below may reference images not displayed]

FINDINGS: Uterus

Measurements: 11.1 x 5.4 x 6.9 cm = volume: 215.5 mL. There is
inhomogeneous echogenicity in the myometrium. There is 1.1 x 0.9 x
1.1 cm fibroid in the right anterior aspect of the body. There is
1.5 x 1.1 x 1.1 cm fibroid in the posterior body on the left side.
There is 0.7 x 0.7 x 0.7 cm submucosal fibroid in the fundus.

Endometrium

Thickness: 18 mm.  No focal abnormality visualized.

Right ovary

Measurements: 4.1 x 2.6 x 3 cm = volume: 17 mL. There is 2.5 x 2 cm
cyst in the right adnexa. There is 6 mm cyst adjacent to the margin
of the largest cyst, possibly a follicle.

Left ovary

Measurements: 3.7 x 1.7 x 1.4 cm = volume: 4.7 mL. There is 1.3 x 1
cm hypoechoic structure, possibly hemorrhagic follicle.

Pulsed Doppler evaluation of both ovaries demonstrates normal
low-resistance arterial and venous waveforms.

Other findings

No abnormal free fluid.
IMPRESSION: There is inhomogeneous echogenicity in the myometrium with small
fibroids. Endometrial stripe is prominent without demonstrable
increased vascularity which may suggest secretory phase of menstrual
cycle. Follow-up sonogram in 2-3 months may be considered to
re-evaluate this finding.

There is 2.5 cm cyst in the right adnexa suggesting dominant
follicle or functional cyst. There is 1.3 cm hypoechoic structure in
the left ovary, possibly hemorrhagic follicle.

ADDENDUM:
This addendum is made to clarify Doppler technique used for the
study. Color flow Doppler examination was done. Pulsed Doppler
examination was not performed.

*** End of Addendum ***
FINDINGS: Uterus

Measurements: 11.1 x 5.4 x 6.9 cm = volume: 215.5 mL. There is
inhomogeneous echogenicity in the myometrium. There is 1.1 x 0.9 x
1.1 cm fibroid in the right anterior aspect of the body. There is
1.5 x 1.1 x 1.1 cm fibroid in the posterior body on the left side.
There is 0.7 x 0.7 x 0.7 cm submucosal fibroid in the fundus.

Endometrium

Thickness: 18 mm.  No focal abnormality visualized.

Right ovary

Measurements: 4.1 x 2.6 x 3 cm = volume: 17 mL. There is 2.5 x 2 cm
cyst in the right adnexa. There is 6 mm cyst adjacent to the margin
of the largest cyst, possibly a follicle.

Left ovary

Measurements: 3.7 x 1.7 x 1.4 cm = volume: 4.7 mL. There is 1.3 x 1
cm hypoechoic structure, possibly hemorrhagic follicle.

Pulsed Doppler evaluation of both ovaries demonstrates normal
low-resistance arterial and venous waveforms.

Other findings

No abnormal free fluid.
IMPRESSION: There is inhomogeneous echogenicity in the myometrium with small
fibroids. Endometrial stripe is prominent without demonstrable
increased vascularity which may suggest secretory phase of menstrual
cycle. Follow-up sonogram in 2-3 months may be considered to
re-evaluate this finding.

There is 2.5 cm cyst in the right adnexa suggesting dominant
follicle or functional cyst. There is 1.3 cm hypoechoic structure in
the left ovary, possibly hemorrhagic follicle.

## 2023-08-02 ENCOUNTER — Encounter: Payer: Self-pay | Admitting: Advanced Practice Midwife
